# Patient Record
Sex: Female | Born: 1985 | Race: White | Hispanic: No | Smoking: Current some day smoker
Health system: Southern US, Community
[De-identification: ages and names within clinical notes are randomized; demographics above are authoritative.]

## PROBLEM LIST (undated history)

## (undated) DIAGNOSIS — O34219 Maternal care for unspecified type scar from previous cesarean delivery: Secondary | ICD-10-CM

## (undated) DIAGNOSIS — F1120 Opioid dependence, uncomplicated: Secondary | ICD-10-CM

## (undated) DIAGNOSIS — O99330 Smoking (tobacco) complicating pregnancy, unspecified trimester: Secondary | ICD-10-CM

## (undated) DIAGNOSIS — A6000 Herpesviral infection of urogenital system, unspecified: Secondary | ICD-10-CM

## (undated) HISTORY — PX: TUBAL LIGATION: SHX77

## (undated) HISTORY — DX: Maternal care for unspecified type scar from previous cesarean delivery: O34.219

## (undated) HISTORY — DX: Smoking (tobacco) complicating pregnancy, unspecified trimester: O99.330

---
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
## 2006-04-30 ENCOUNTER — Inpatient Hospital Stay (HOSPITAL_COMMUNITY): Admission: AD | Admit: 2006-04-30 | Discharge: 2006-04-30 | Payer: Self-pay | Admitting: Obstetrics and Gynecology

## 2006-06-13 ENCOUNTER — Inpatient Hospital Stay (HOSPITAL_COMMUNITY): Admission: AD | Admit: 2006-06-13 | Discharge: 2006-06-17 | Payer: Self-pay | Admitting: Obstetrics & Gynecology

## 2006-06-13 ENCOUNTER — Ambulatory Visit: Payer: Self-pay | Admitting: Obstetrics & Gynecology

## 2006-10-23 ENCOUNTER — Emergency Department (HOSPITAL_COMMUNITY): Admission: EM | Admit: 2006-10-23 | Discharge: 2006-10-24 | Payer: Self-pay | Admitting: Emergency Medicine

## 2007-06-17 ENCOUNTER — Emergency Department (HOSPITAL_COMMUNITY): Admission: EM | Admit: 2007-06-17 | Discharge: 2007-06-17 | Payer: Self-pay | Admitting: Emergency Medicine

## 2007-09-22 ENCOUNTER — Inpatient Hospital Stay (HOSPITAL_COMMUNITY): Admission: AD | Admit: 2007-09-22 | Discharge: 2007-09-22 | Payer: Self-pay | Admitting: Obstetrics & Gynecology

## 2007-09-25 ENCOUNTER — Inpatient Hospital Stay (HOSPITAL_COMMUNITY): Admission: AD | Admit: 2007-09-25 | Discharge: 2007-09-25 | Payer: Self-pay | Admitting: Obstetrics & Gynecology

## 2007-09-27 ENCOUNTER — Inpatient Hospital Stay (HOSPITAL_COMMUNITY): Admission: AD | Admit: 2007-09-27 | Discharge: 2007-09-27 | Payer: Self-pay | Admitting: Family Medicine

## 2007-10-16 ENCOUNTER — Ambulatory Visit: Payer: Self-pay | Admitting: Family Medicine

## 2008-01-11 ENCOUNTER — Emergency Department (HOSPITAL_COMMUNITY): Admission: EM | Admit: 2008-01-11 | Discharge: 2008-01-11 | Payer: Self-pay | Admitting: Emergency Medicine

## 2008-04-10 ENCOUNTER — Inpatient Hospital Stay (HOSPITAL_COMMUNITY): Admission: AD | Admit: 2008-04-10 | Discharge: 2008-04-10 | Payer: Self-pay | Admitting: Obstetrics and Gynecology

## 2008-06-19 ENCOUNTER — Inpatient Hospital Stay (HOSPITAL_COMMUNITY): Admission: AD | Admit: 2008-06-19 | Discharge: 2008-06-19 | Payer: Self-pay | Admitting: Obstetrics and Gynecology

## 2008-07-22 ENCOUNTER — Inpatient Hospital Stay (HOSPITAL_COMMUNITY): Admission: AD | Admit: 2008-07-22 | Discharge: 2008-07-25 | Payer: Self-pay | Admitting: Obstetrics and Gynecology

## 2008-09-16 ENCOUNTER — Inpatient Hospital Stay (HOSPITAL_COMMUNITY): Admission: AD | Admit: 2008-09-16 | Discharge: 2008-09-17 | Payer: Self-pay | Admitting: Obstetrics and Gynecology

## 2008-11-11 ENCOUNTER — Inpatient Hospital Stay (HOSPITAL_COMMUNITY): Admission: AD | Admit: 2008-11-11 | Discharge: 2008-11-11 | Payer: Self-pay | Admitting: Obstetrics and Gynecology

## 2008-11-20 IMAGING — CT CT ABDOMEN W/ CM
2 of 5 series · 17 of 46 positions shown, 19 images · IV contrast (100 ML OMNI 300)
Comparison: None

CT ABDOMEN

CLINICAL DATA: Bilateral flank pain for 1 day with fever

CT ABDOMEN AND PELVIS WITH CONTRAST
TECHNIQUE: Multidetector CT imaging of the abdomen and pelvis was
performed using the standard protocol following bolus
administration of intravenous contrast.
Contrast: 100 ml Jmnipaque-YDD IV

[Series 2: a&p w/ · axial · 0.64mm/px · z∈[-387,-32]mm · 14 of 79 slices shown, 16 images]
[im 5/79  soft-tissue]
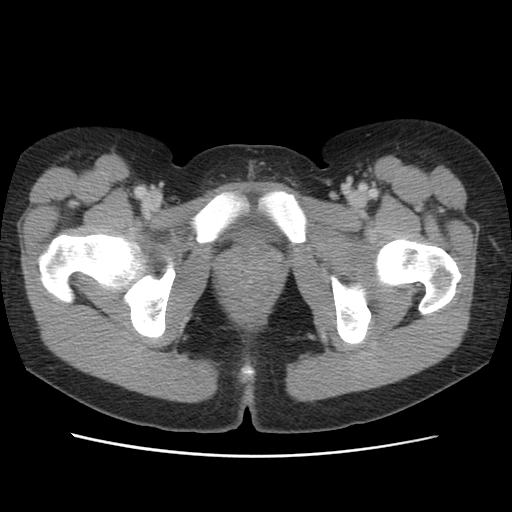
[im 5/79  bone]
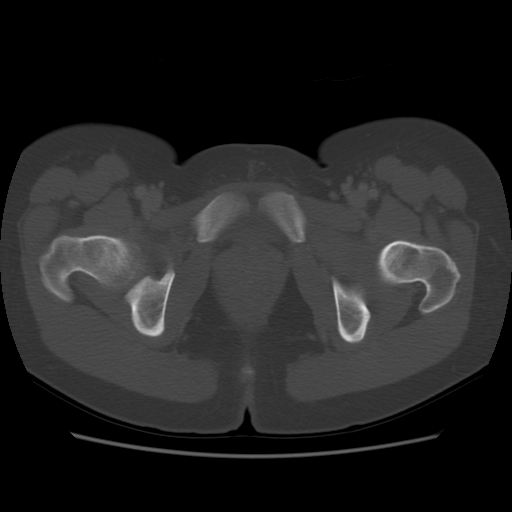
[im 9/79  soft-tissue]
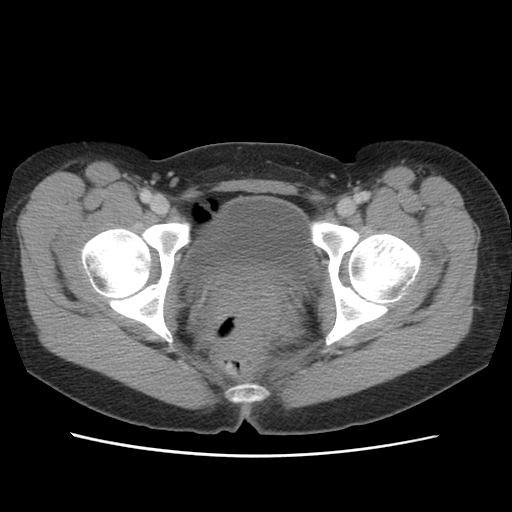
[im 18/79  soft-tissue]
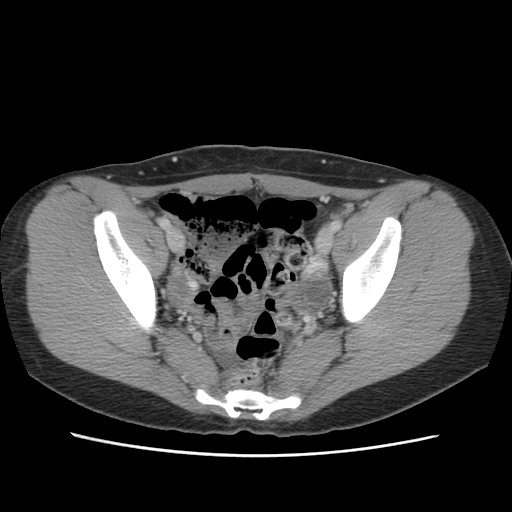
[im 22/79  soft-tissue]
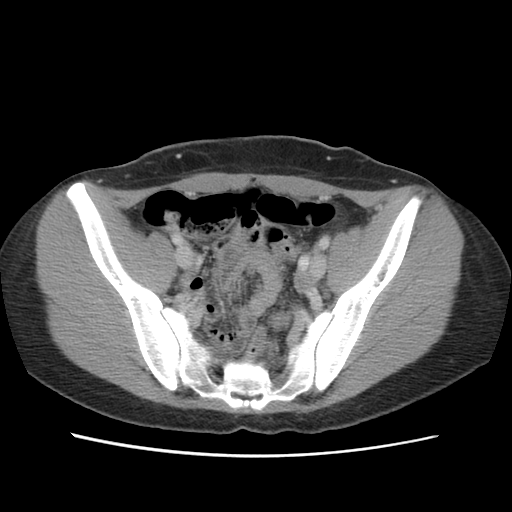
[im 27/79  soft-tissue]
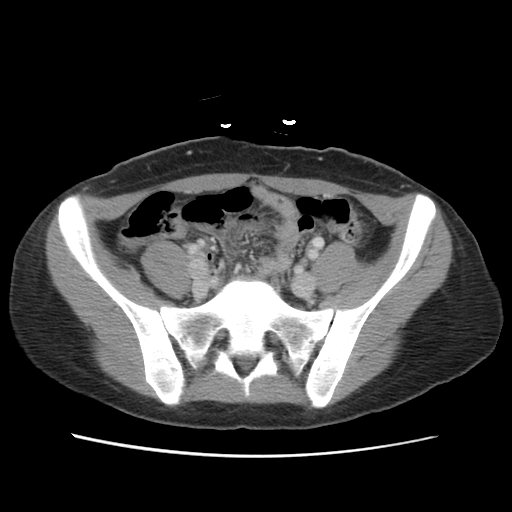
[im 31/79  soft-tissue]
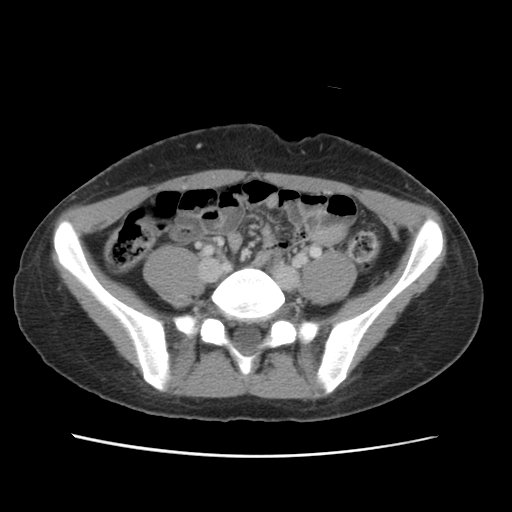
[im 35/79  soft-tissue]
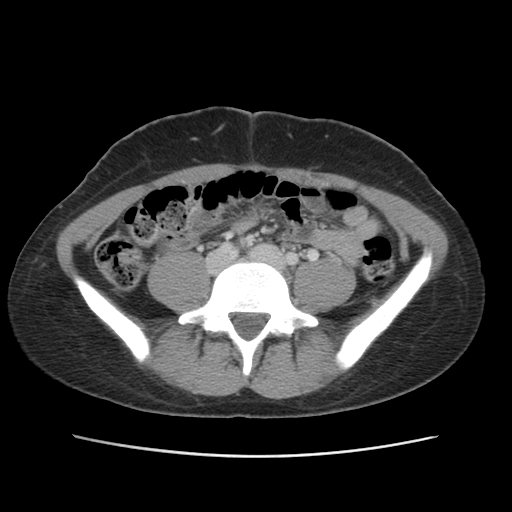
[im 44/79  soft-tissue]
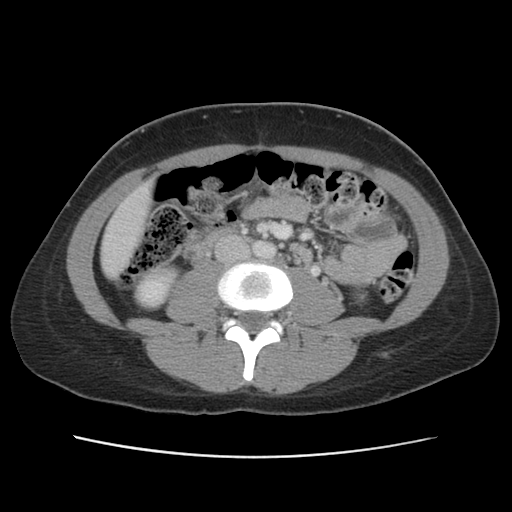
[im 48/79  soft-tissue]
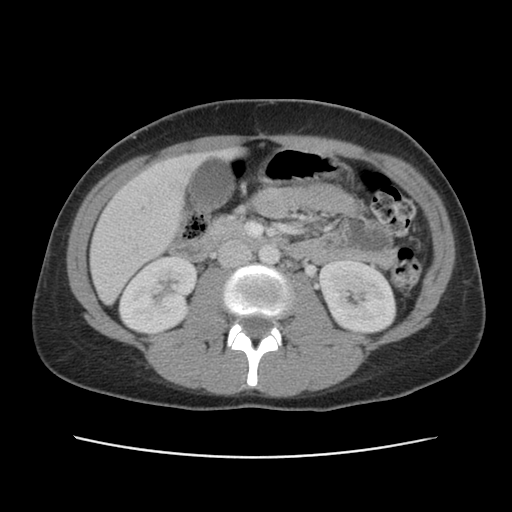
[im 48/79  bone]
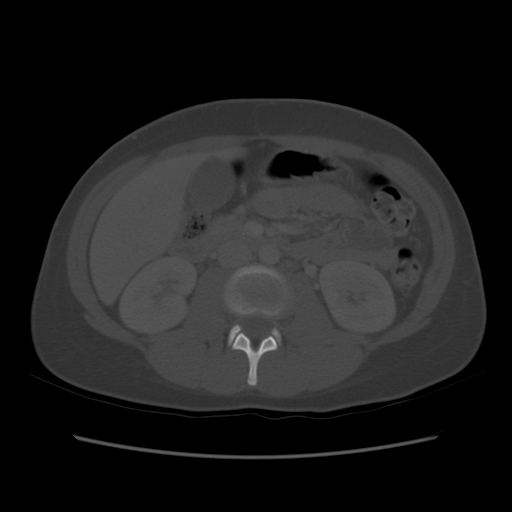
[im 53/79  soft-tissue]
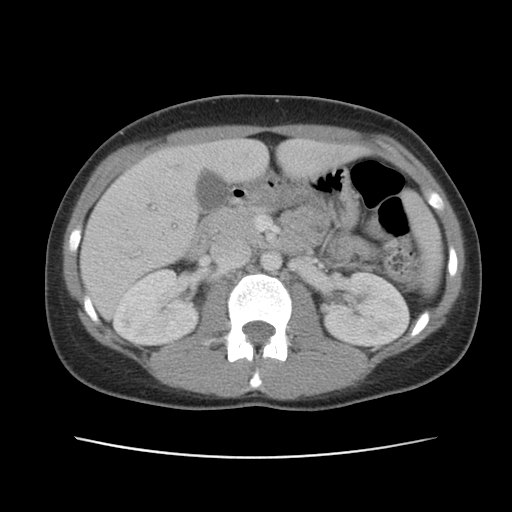
[im 57/79  soft-tissue]
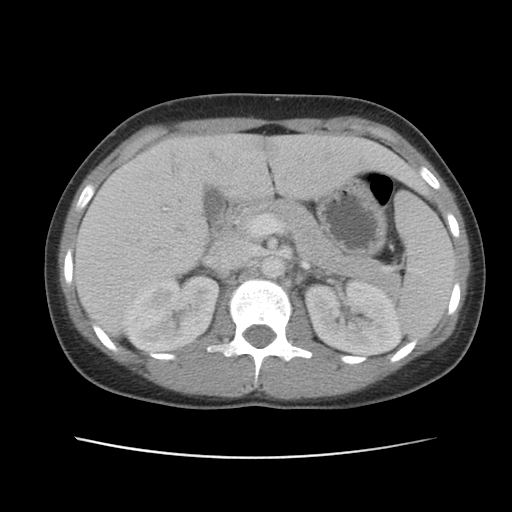
[im 61/79  soft-tissue]
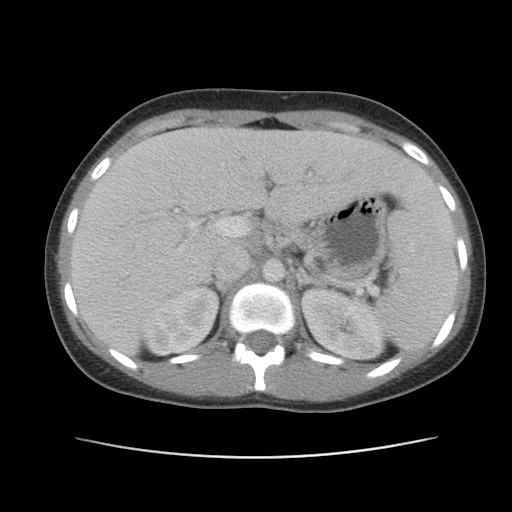
[im 70/79  soft-tissue]
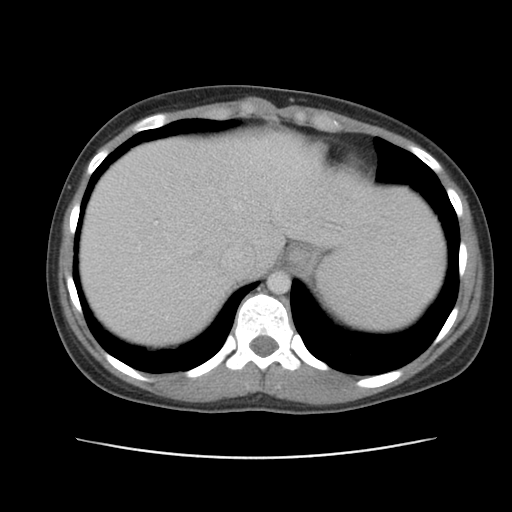
[im 74/79  soft-tissue]
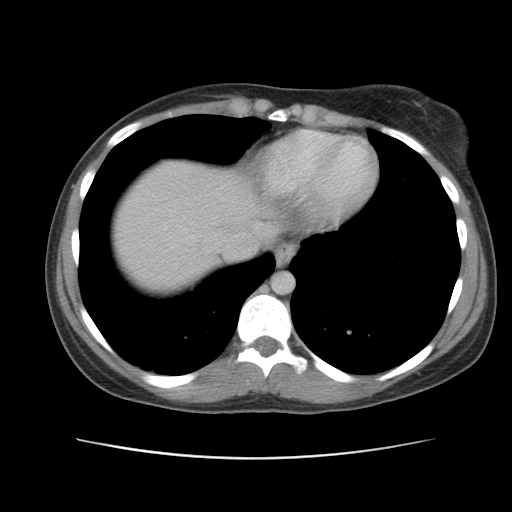

[Series 400: cor abd · coronal · 0.86mm/px · 3 of 88 slices shown]
[im 30/88  soft-tissue]
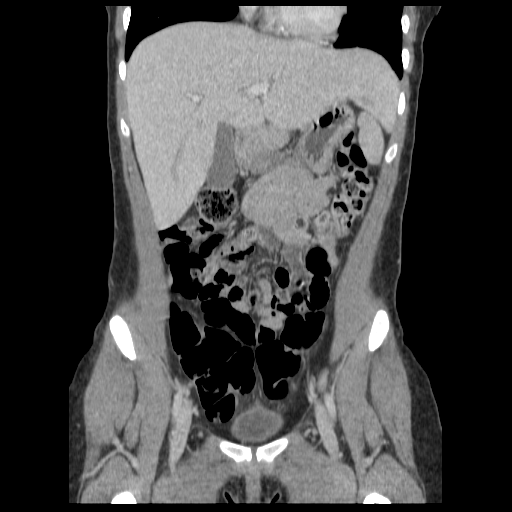
[im 39/88  soft-tissue]
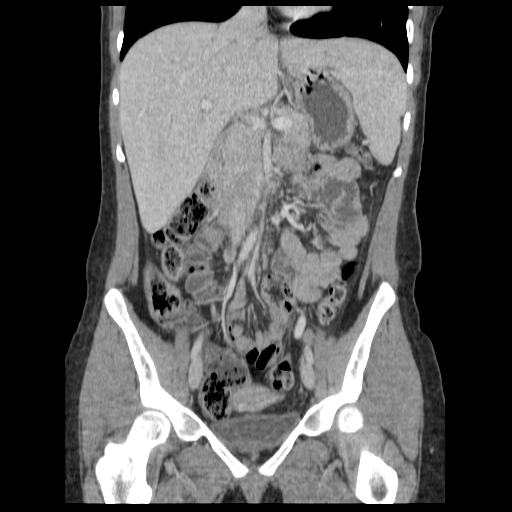
[im 49/88  soft-tissue]
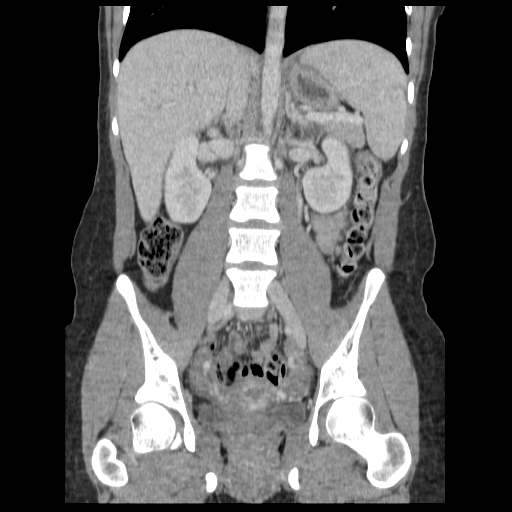

[17 of 46 positions shown; findings below may reference images not displayed]

FINDINGS: No acute renal or perirenal abnormality.  Although the
images of the kidneys are unremarkable, acute pyelonephritis cannot
be excluded.  Spleen appears mildly prominent in size.  CT findings
compatible mild periportal edema, the etiology which is uncertain.
This could be due to a process such as acute hepatitis.  Are the
liver function values within normal limits.  The pancreas and
gallbladder, and adrenal glands are unremarkable.  No biliary
ductal dilatation.
IMPRESSION: Minimal splenomegaly.  Unremarkable appearance of the kidneys.
Mild periportal edema pattern raises the question of an hepatic
process such as acute hepatitis.

CT PELVIS
FINDINGS: No pelvic mass.  Bilateral ovarian follicles.  Small
amount of free  fluid in the cul-de-sac, a nonspecific finding.
This may be physiologic.
IMPRESSION: Small amount of free pelvic fluid, nonspecific.

## 2008-11-21 ENCOUNTER — Inpatient Hospital Stay (HOSPITAL_COMMUNITY): Admission: AD | Admit: 2008-11-21 | Discharge: 2008-11-21 | Payer: Self-pay | Admitting: Obstetrics & Gynecology

## 2008-11-21 ENCOUNTER — Ambulatory Visit: Payer: Self-pay | Admitting: Advanced Practice Midwife

## 2008-11-30 ENCOUNTER — Inpatient Hospital Stay (HOSPITAL_COMMUNITY): Admission: RE | Admit: 2008-11-30 | Discharge: 2008-12-03 | Payer: Self-pay | Admitting: Obstetrics and Gynecology

## 2010-02-04 ENCOUNTER — Emergency Department (HOSPITAL_COMMUNITY): Admission: EM | Admit: 2010-02-04 | Discharge: 2010-02-04 | Payer: Self-pay | Admitting: Emergency Medicine

## 2010-03-03 ENCOUNTER — Emergency Department (HOSPITAL_COMMUNITY): Admission: EM | Admit: 2010-03-03 | Discharge: 2010-03-03 | Payer: Self-pay | Admitting: Emergency Medicine

## 2010-07-23 HISTORY — DX: Maternal care for unspecified type scar from previous cesarean delivery: O34.219

## 2010-09-30 ENCOUNTER — Emergency Department (HOSPITAL_COMMUNITY)
Admission: EM | Admit: 2010-09-30 | Discharge: 2010-09-30 | Disposition: A | Discharge status: Home / Self Care | Payer: Self-pay | Attending: Emergency Medicine | Admitting: Emergency Medicine

## 2010-09-30 ENCOUNTER — Emergency Department (HOSPITAL_COMMUNITY): Payer: Self-pay

## 2010-09-30 DIAGNOSIS — R0789 Other chest pain: Secondary | ICD-10-CM | POA: Insufficient documentation

## 2010-09-30 LAB — POCT PREGNANCY, URINE: Preg Test, Ur: NEGATIVE

## 2010-10-06 LAB — URINALYSIS, ROUTINE W REFLEX MICROSCOPIC
Bilirubin Urine: NEGATIVE
Protein, ur: NEGATIVE mg/dL
Specific Gravity, Urine: 1.018 (ref 1.005–1.030)
Urobilinogen, UA: 1 mg/dL (ref 0.0–1.0)
pH: 7 (ref 5.0–8.0)

## 2010-10-06 LAB — URINE MICROSCOPIC-ADD ON

## 2010-10-07 LAB — CBC
Hemoglobin: 12.6 g/dL (ref 12.0–15.0)
MCH: 33.8 pg (ref 26.0–34.0)
MCV: 97.8 fL (ref 78.0–100.0)
RBC: 3.74 MIL/uL — ABNORMAL LOW (ref 3.87–5.11)

## 2010-10-07 LAB — URINALYSIS, ROUTINE W REFLEX MICROSCOPIC
Bilirubin Urine: NEGATIVE
Hgb urine dipstick: NEGATIVE
Ketones, ur: NEGATIVE mg/dL
Specific Gravity, Urine: 1.01 (ref 1.005–1.030)
Urobilinogen, UA: 0.2 mg/dL (ref 0.0–1.0)
pH: 6.5 (ref 5.0–8.0)

## 2010-10-07 LAB — WET PREP, GENITAL

## 2010-10-07 LAB — BASIC METABOLIC PANEL
CO2: 26 mEq/L (ref 19–32)
Calcium: 8.5 mg/dL (ref 8.4–10.5)
Chloride: 111 mEq/L (ref 96–112)
GFR calc Af Amer: >60 mL/min (ref >60)
Sodium: 141 mEq/L (ref 135–145)

## 2010-10-07 LAB — POCT PREGNANCY, URINE: Preg Test, Ur: NEGATIVE

## 2010-10-07 LAB — DIFFERENTIAL
Eosinophils Absolute: 0.3 10*3/uL (ref 0.0–0.7)
Lymphs Abs: 2.9 10*3/uL (ref 0.7–4.0)
Monocytes Relative: 6 % (ref 3–12)
Neutrophils Relative %: 55 % (ref 43–77)

## 2010-10-07 LAB — GC/CHLAMYDIA PROBE AMP, GENITAL: GC Probe Amp, Genital: NEGATIVE

## 2010-10-31 LAB — RPR: RPR Ser Ql: NONREACTIVE

## 2010-10-31 LAB — CBC
HCT: 28.8 % — ABNORMAL LOW (ref 36.0–46.0)
HCT: 37.1 % (ref 36.0–46.0)
Hemoglobin: 13.2 g/dL (ref 12.0–15.0)
MCV: 97.3 fL (ref 78.0–100.0)
Platelets: 149 10*3/uL — ABNORMAL LOW (ref 150–400)
Platelets: 212 10*3/uL (ref 150–400)
WBC: 12.4 10*3/uL — ABNORMAL HIGH (ref 4.0–10.5)
WBC: 13.4 10*3/uL — ABNORMAL HIGH (ref 4.0–10.5)

## 2010-10-31 LAB — CCBB MATERNAL DONOR DRAW

## 2010-10-31 LAB — TYPE AND SCREEN
ABO/RH(D): O POS
Antibody Screen: NEGATIVE

## 2010-11-06 LAB — COMPREHENSIVE METABOLIC PANEL
ALT: 10 U/L (ref 0–35)
AST: 16 U/L (ref 0–37)
Alkaline Phosphatase: 50 U/L (ref 39–117)
CO2: 24 mEq/L (ref 19–32)
Chloride: 109 mEq/L (ref 96–112)
GFR calc non Af Amer: >60 mL/min (ref >60)
Glucose, Bld: 92 mg/dL (ref 70–99)
Potassium: 3.5 mEq/L (ref 3.5–5.1)
Sodium: 139 mEq/L (ref 135–145)

## 2010-11-06 LAB — DIFFERENTIAL
Basophils Relative: 0 % (ref 0–1)
Basophils Relative: 0 % (ref 0–1)
Eosinophils Absolute: 0 10*3/uL (ref 0.0–0.7)
Eosinophils Absolute: 0.1 10*3/uL (ref 0.0–0.7)
Eosinophils Relative: 1 % (ref 0–5)
Monocytes Relative: 8 % (ref 3–12)
Monocytes Relative: 8 % (ref 3–12)
Neutro Abs: 5.6 10*3/uL (ref 1.7–7.7)
Neutrophils Relative %: 69 % (ref 43–77)
Neutrophils Relative %: 77 % (ref 43–77)

## 2010-11-06 LAB — CBC
Hemoglobin: 9.6 g/dL — ABNORMAL LOW (ref 12.0–15.0)
MCHC: 34.9 g/dL (ref 30.0–36.0)
Platelets: 151 10*3/uL (ref 150–400)
RBC: 2.69 MIL/uL — ABNORMAL LOW (ref 3.87–5.11)
RBC: 2.8 MIL/uL — ABNORMAL LOW (ref 3.87–5.11)
WBC: 8.1 10*3/uL (ref 4.0–10.5)
WBC: 9.8 10*3/uL (ref 4.0–10.5)

## 2010-11-07 LAB — CBC
MCV: 98.1 fL (ref 78.0–100.0)
Platelets: 238 10*3/uL (ref 150–400)
RBC: 3.52 MIL/uL — ABNORMAL LOW (ref 3.87–5.11)
WBC: 15.3 10*3/uL — ABNORMAL HIGH (ref 4.0–10.5)

## 2010-11-07 LAB — URINALYSIS, ROUTINE W REFLEX MICROSCOPIC
Hgb urine dipstick: NEGATIVE
Nitrite: NEGATIVE
Protein, ur: NEGATIVE mg/dL
Specific Gravity, Urine: 1.025 (ref 1.005–1.030)
Urobilinogen, UA: 0.2 mg/dL (ref 0.0–1.0)

## 2010-11-17 ENCOUNTER — Emergency Department (HOSPITAL_BASED_OUTPATIENT_CLINIC_OR_DEPARTMENT_OTHER)
Admission: EM | Admit: 2010-11-17 | Discharge: 2010-11-17 | Disposition: A | Discharge status: Home / Self Care | Payer: Self-pay | Attending: Emergency Medicine | Admitting: Emergency Medicine

## 2010-11-17 DIAGNOSIS — F17200 Nicotine dependence, unspecified, uncomplicated: Secondary | ICD-10-CM | POA: Insufficient documentation

## 2010-11-17 DIAGNOSIS — Y92410 Unspecified street and highway as the place of occurrence of the external cause: Secondary | ICD-10-CM | POA: Insufficient documentation

## 2010-11-17 DIAGNOSIS — S239XXA Sprain of unspecified parts of thorax, initial encounter: Secondary | ICD-10-CM | POA: Insufficient documentation

## 2010-12-05 NOTE — Op Note (Signed)
[name], [name]            ACCOUNT NO.:  [id number]   MEDICAL RECORD NO.:  [id number]          PATIENT TYPE:  INP   LOCATION:  9133                          [facility]ACILITY:  [facility]   PHYSICIAN:  Kendra H. Tenny Craw, MD     DATE OF BIRTH:  [birth year]   DATE OF PROCEDURE:  11/30/2008  DATE OF DISCHARGE:                               OPERATIVE REPORT   PREOPERATIVE DIAGNOSES:  1. A 39/5-week intrauterine pregnancy.  2. History of prior low transverse cesarean section, declines trial of      labor.   POSTOPERATIVE DIAGNOSES:  1. A 39/5-week intrauterine pregnancy.  2. History of prior low transverse cesarean section, declines trial of      labor.   PROCEDURE:  Repeat low transverse cesarean section.   SURGEON:  Freddrick March. Tenny Craw, MD   ASSISTANT:  Haley Aschoff, MD   ANESTHESIA:  Spinal.   OPERATIVE FINDINGS:  Vigorous female infant in the vertex presentation  weighing 6 pounds 7 ounces with Apgar scores of 9 at 1 and 9 and 5  minutes.  Normal-appearing ovaries, tubes, and uterus.  Slight adhesive  disease involving the lower uterine segment and the anterior abdominal  peritoneum.   SPECIMENS:  Placenta disposed specimens to L and D for disposal.   ESTIMATED BLOOD LOSS:  600 mL.   URINE OUTPUT:  300 mL of clear urine.   COMPLICATIONS:  None.   PROCEDURE:  Haley Wiley is a 25 year old G3, P1-0-1-1, who presented 39  weeks and 5 days estimated gestational age for a scheduled repeat  cesarean section for history of a prior C-section and declining a trial  of labor.  Following the appropriate informed consent, the patient was  brought to the operating room, where spinal anesthesia was administered  and found to be adequate.  She was placed in the dorsal supine position  with a leftward tilt, prepped, and draped in the normal sterile fashion.  Scalpel was used to make a Pfannenstiel's skin incision along her prior  Pfannenstiel incision.  This incision was carried down through the  underlying layers of soft tissue to the fascia.  Fascia was incised in  the midline.  Fascial incision was extended laterally with Mayo  scissors.  The superior aspect of the fascial incision was grasped with  Kocher clamps x2.  The tented up the underlying rectus muscle was  dissected off sharply with the electrocautery unit.  The same procedure  was repeated on the inferior aspect of the fascial incision.  The rectus  muscles were then separated in the midline.  The abdominal peritoneum  was identified, tented up, entered sharply with the Metzenbaum.  The  incision was extended superiorly and inferiorly with good visualization  of the bladder.  The anterior abdominal peritoneum was dissected off the  vesicouterine peritoneum, where it had become adhesed from her prior  surgery and the bladder flap was created digitally.  Scalpel was then  used to make a low transverse incision on the uterus which was extended  laterally with blunt dissection.  Amniotomy was performed for clear  fluid.  Fetal vertex was identified  and easily delivered through the  uterine incision followed by the body.  A loose nuchal cord x1 was  noted.  Infant cried vigorously on the operative field.  Cord was  clamped and cut.  Infant was passed to the awaiting pediatricians.  The  placenta was then spontaneously delivered.  The uterus was exteriorized,  cleared of all clot and debris.  The uterine incision was repaired with  #1 chromic in a running locked fashion with a second imbricating layer.  Ovaries and tubes were inspected and found to be normal.  Uterus was  returned to the abdominal cavity.  Abdominal cavity was cleared of all  clot and debris.  The abdominal peritoneum was then reapproximated with  2-0 Vicryl.  The rectus muscles were reapproximated with #1 chromic in a  running fashion.  The fascia was closed with a looped PDS and the skin  was closed with staples.  All sponge, lap, needle counts were  correct  x2.  The patient tolerated the procedure well and was brought to the  recovery room in stable condition following the procedure.      Freddrick March. Tenny Craw, MD  Electronically Signed     KHR/MEDQ  D:  11/30/2008  T:  12/01/2008  Job:  [id number]

## 2010-12-05 NOTE — Discharge Summary (Signed)
NAMEELIDA, Haley Wiley            ACCOUNT NO.:  [id number]   MEDICAL RECORD NO.:  [id number]          PATIENT TYPE:  INP   LOCATION:  9155                          [facility]ACILITY:  [facility]   PHYSICIAN:  Gerrit Friends. Aldona Bar, M.D.   DATE OF BIRTH:  [birth year]   DATE OF ADMISSION:  07/22/2008  DATE OF DISCHARGE:  07/26/2007                               DISCHARGE SUMMARY   DISCHARGE DIAGNOSES:  1. A 21-week intrauterine pregnancy, delivered.  2. Presumed pyelonephritis - culture negative.   SUMMARY:  This 25 year old gravida 3, para 1 was admitted at 21 weeks'  gestation on December 31 by Dr. Henderson Cloud because of suspected  pyelonephritis.  She was begun on Ancef intravenously.  Her white count  responded.  Her low-grade fever defervesced.  On the morning of January  1, she was still very uncomfortable especially in the left CVA area.  An  ultrasound was done to rule out ureteral obstruction.  Ultrasound did  rule out ureteral obstruction.  She was continued on her Ancef,  ultimately having it decreased on January 1 and on the morning of  January 3, it was felt to be doing much better, was afebrile, vital  signs were otherwise stable, and her white count returned as being  normal along with normal differential.  Her examination found minimal  tenderness and the patient was anxious to be discharged and accordingly  was given all appropriate instructions and discharged to home.   DISCHARGE MEDICATIONS:  Vitamins 1 a day as she has been doing, Tylenol  as needed for discomfort in appropriate doses, and she was given a  prescription for Keflex 500 mg to use 3 times a day for the next 5 days.   She will return to the office in follow up in approximately 10 days time  or as needed.  It is of note that her culture was negative - however a  previous culture done in June 2009 where she presented with similar  symptoms before she got pregnant, did grow E-coli greater than 1000  colonies per mL.   CONDITION  ON DISCHARGE:  Improved.   FOLLOWUP:  As mentioned.      Gerrit Friends. Aldona Bar, M.D.  Electronically Signed     RMW/MEDQ  D:  07/25/2008  T:  07/25/2008  Job:  [id number]

## 2010-12-05 NOTE — Group Therapy Note (Addendum)
Haley Wiley, Haley Wiley            ACCOUNT NO.:  [id number]   MEDICAL RECORD NO.:  [id number]          PATIENT TYPE:  WOC   LOCATION:  [facility]                   FACILITY:  [facility]   PHYSICIAN:  Tinnie Gens, MD        DATE OF BIRTH:  1986-07-01   DATE OF SERVICE:  10/16/2007                                  CLINIC NOTE   CHIEF COMPLAINT:  Follow-up SAB.   HISTORY OF PRESENT ILLNESS:  The patient is a 25 year old gravida 2,  para 1-0-1-1 who had a miscarriage at 5 weeks approximately 3 weeks ago.  She is here for follow-up.  She is without significant complaint today.  Her bleeding has been resolved for the past week.   PAST MEDICAL HISTORY:  Heart murmur.   PAST SURGICAL HISTORY:  Negative.   MEDICATIONS:  None.   ALLERGIES:  None known.   OBSTETRICAL HISTORY:  She is a G2, P1 with one C section.   GYN HISTORY:  Menarche at age 17, cycles are monthly.  Lasts 5-7 days  with medium flow.  No history of abnormal Pap.   FAMILY HISTORY:  Heart disease, hypertension and lung cancer.   SOCIAL HISTORY:  She is a smoker. One-half pack per day for the past 11  years.  No ____ QA MARKER: 81 alcohol use.                                    REVIEW OF SYSTEMS:  14 point review of systems reviewed.  Please see GYN  history in the chart.  Positive for muscle aches, fevers, night sweats,  fatigue, headaches, dizzy spells, vaginal bleeding and painful  intercourse.   PHYSICAL EXAMINATION:  VITALS:  On exam today her vitals are as in the  chart.  GENERAL:  She well-nourished female in no acute distress.  ABDOMEN:  Soft, nontender, nondistended.  GU:  Normal external female genitalia, BUN normal.  Vagina is pink and  rugated.  Cervix is nulliparous without lesion.  Uterus is small,  anteverted.  No adnexal mass or tenderness.   IMPRESSION:  Status post SAB.   PLAN:  1. Discussed birth control.  2. Do not get pregnant for 3 months. Increased risk of congenital      anomaly.  3. Needs  Pap, last was in 06/2006. [facility]  4. Should get Gardisil series.  5. Stop smoking.  6. Extended conversation about  problem or risk of miscarriage and      reason for it was had with this patient and her partner.           ______________________________  Tinnie Gens, MD     TP/MEDQ  D:  10/16/2007  T:  10/17/2007  Job:  [id number]

## 2010-12-08 NOTE — Op Note (Signed)
NAMESIBLE, STRALEY              ACCOUNT NO.:  [id number]   MEDICAL RECORD NO.:  [id number]          PATIENT TYPE:  INP   LOCATION:  9126                          [facility]   PHYSICIAN:  Kendra H. Tenny Craw, MD     DATE OF BIRTH:  12/03/1985   DATE OF PROCEDURE:  06/14/2006  DATE OF DISCHARGE:                                 OPERATIVE REPORT   PREOPERATIVE DIAGNOSES:  1. A 41-week intrauterine pregnancy.  2. Deep transverse arrest.   POSTOPERATIVE DIAGNOSES:  1. A 41-week intrauterine pregnancy.  2. Deep transverse arrest.   PROCEDURE:  Primary low transverse cesarean section via Pfannenstiel skin  incision.   SURGEON:  Freddrick March. Tenny Craw, M.D.   ASSISTANT:  Lesly Dukes, M.D.   ANESTHESIA:  Epidural.   SPECIMEN:  Placenta to labor and delivery.   ESTIMATED BLOOD LOSS:  700 mL.   COMPLICATIONS:  None.   OPERATIVE FINDINGS:  Female infant in the vertex right occiput transverse  presentation, weight 8 pounds, with Apgar scores of 5, 7 and 8.  Cord gas  was collected but was not sent due to inadequate amount of arterial blood  collected.   PROCEDURE:  Miss Kalman Jewels is a 25 year old g-1 p-0 at [redacted] weeks gestational age  who presented to labor and delivery in spontaneous labor on June 13, 2006.  She progressed in labor and made it to complete-complete.  At pushing  she pushed for 2 hours, with little descent of the fetal head.  On exam, the  fetal vertex was found to be in the right occiput transverse position.  Despite multiple attempts to rotate the fetal head with maternal expulsive  effort, the infant was unable to be rotated and the decision was made to  proceed with primary low transverse cesarean section.  Following the  appropriate informed consent, the patient was brought to the operating room,  where she was placed in the dorsal supine position with a leftward tilt and  prepped and draped in the normal sterile fashion.  Epidural anesthesia was  confirmed to be  adequate.  A Pfannenstiel skin incision was made with the  knife and carried down through the underlying layers of soft tissue to the  fascia.  The fascia was incised in the midline, fascial incision was  extended laterally with the Mayo scissors.  The superior aspect of the  fascial incision was grasped with Kocher clamps x2, tented up and the  underlying  rectus muscle was dissected off sharply with the electrocautery  unit.  The same procedure was repeated on the inferior aspect of the fascial  incision.  The rectus muscles were then separated in the midline.  The  abdominal peritoneum was identified, tented up, entered sharply with the  Metzenbaum scissors and the incision was extended inferiorly and superiorly,  with good visualization of the bladder.  The bladder blade was then inserted  and the vesicouterine pertoneum was identified, tented up, entered sharply  with the Metzenbaum scissors.  The incision was extended laterally and the  bladder flap was created digitally.  The bladder flap was  then reinserted.  The scalpel was used to make a low transverse incision on the uterus, which  was extended laterally with blunt dissection.  The fetal vertex was  identified, brought up through the uterine incision and delivered  atraumatically, followed by the infant's body.  The infant was bulb  suctioned on the operative field.  Cord was clamped and cut.  The infant was  passed to the waiting pediatricians.  An attempt at arterial blood  collection was made.  However, this was unsuccessful due to inadequate  amount of arterial blood to send gases.  The placenta was initially  attempted to be delivered spontaneously.  However, ultimately required  manual extraction.  The uterus was exteriorized, cleared of all clot debris.  The uterine incision was closed with 0 Vicryl in a running locked fashion.  Two figure-of-eight sutures were used on the right-hand aspect of the  fascial incision to  control bleeding.  An electrocautery unit was used to  coagulate other extraneous bleeders.  Of note, during the repair of the  uterine incision the uterus was noted to be boggy.  The patient's bleeding  was not extensive.  However, given the boggy nature of the uterus the  decision was made to administer Methergine IM x1 and an additional 20 units  of Pitocin was placed in the second bag of IV fluids for a total of 40 units  in a liter.  Following repair of the uterine incision, the ovaries and tubes  were inspected and found to be within normal limits.  The uterine incision  was found to be hemostatic.  The uterus was placed in the abdominal cavity.  The abdominal cavity was irrigated and cleared of clot debris.  The uterine  incision was then reinspected and found to be hemostatic and the peritoneum  was then closed with 2-0 Vicryl in a running fashion and the fascia was  closed with 0 looped PDS in a running fashion and the skin was closed with  staples.  All sponge, lap and needle counts were correct x2.  The patient  tolerated the procedure well and was brought to the recovery room in stable  condition following the procedure.           ______________________________  Freddrick March. Tenny Craw, MD     KHR/MEDQ  D:  06/14/2006  T:  06/14/2006  Job:  [id number]   cc:   Lesly Dukes, M.D.

## 2010-12-08 NOTE — Discharge Summary (Signed)
Haley Wiley, Haley Wiley            ACCOUNT NO.:  [id number]   MEDICAL RECORD NO.:  [id number]          PATIENT TYPE:  INP   LOCATION:  9133                          [facility]   PHYSICIAN:  Malva Limes, M.D.    DATE OF BIRTH:  18-Jan-1986   DATE OF ADMISSION:  11/30/2008  DATE OF DISCHARGE:  12/03/2008                               DISCHARGE SUMMARY   FINAL DIAGNOSES:  1. Intrauterine gestation at 39-5/7 weeks.  2. History of prior cesarean section.  3. The patient to decline a trial of labor.   PROCEDURE:  Repeat low transverse cesarean section.   SURGEON:  Freddrick March. Tenny Craw, MD.   ASSISTANT:  Miguel Aschoff, M.D.   COMPLICATIONS:  None.   HOSPITAL COURSE:  This 25 year old, G3, P1-0-1-1 presented at 39-5/[redacted]  weeks gestation for repeat cesarean section.  The patient had a prior  cesarean section with her last pregnancy and declines a trial of labor  with this pregnancy.  The patient's antepartum course up to this point  had been uncomplicated.  She did have a positive group B strep culture  obtained in our office at 35 weeks.  The patient is taken the operating  room on Nov 30, 2008 by Dr. Waynard Reeds, a repeat low transverse  cesarean section was performed with the delivery of a 6 pounds 7 ounces  female infant with Apgars of 9 and 9.  There was some adhesions noted in  the lower uterine segment and anterior abdominal peritoneum.  The  patient tolerated the procedure well.  The patient's postoperative  course was benign without any significant fevers.  She did have the  little boy circumcised prior to discharge.  The patient was sent home on  a regular diet, told to decrease activities, told to continue her  prenatal vitamins, was given Percocet 1-2 every 4-6 hours as needed for  pain, was to follow up in our office in 4 weeks.  Instructions and  precautions were reviewed with the patient.  Labs on discharge, the  patient had a hemoglobin of 10.3, white blood cell count of 12.4,  and  platelets of 149,000.      Leilani Able, P.A.-C.    ______________________________  Malva Limes, M.D.    MB/MEDQ  D:  12/15/2008  T:  12/16/2008  Job:  [id number]

## 2010-12-08 NOTE — Discharge Summary (Signed)
Haley Wiley, Haley Wiley              ACCOUNT NO.:  [id number]   MEDICAL RECORD NO.:  [id number]          PATIENT TYPE:  INP   LOCATION:  9126                          [facility]   PHYSICIAN:  Gerrit Friends. Aldona Bar, M.D.   DATE OF BIRTH:  1986/02/23   DATE OF ADMISSION:  06/13/2006  DATE OF DISCHARGE:  06/17/2006                                 DISCHARGE SUMMARY   DISCHARGE DIAGNOSES:  1. Intrauterine pregnancy, 41 weeks, delivered 8-pound female infant, Apgars      5, 7, and 8.  2. Blood type, O+.  3. Deep transverse arrest.   PROCEDURE:  Primary low-transverse cesarean section.   SUMMARY:  This patient was admitted on the evening of 06/13/06 in early  active labor.  She is a 25 year old primigravida at [redacted] weeks gestation.  Membranes ruptured at 1 a.m. on 06/14/06 and the patient was placed on  Pitocin augmentation with progression.  She was noted to have continued  progression until the mid afternoon of 06/14/06 at which time she began  pushing.  There was a problem with pushing in that the patient developed a  deep transverse arrest and in spite of multiple attempts to rotate manually,  it was not correctable.   The patient was taken to the operating room where she underwent a primary  low transverse cesarean section with delivery of an 8-pound female infant with  Apgars of 5, 7, and 8.  The patient's postpartum course was totally benign.  Her discharge hemoglobin was 11 with a white count of 23,700.   On the morning of 06/17/06, the baby was circumcised and the mother's  evaluation was negative.  Her wound was clean and dry.  Fundus was firm.  Vital signs were stable.  She was pumping and bottle-feeding, tolerating a  regular diet well, having normal bowel and bladder function, was desirous of  discharge.  Accordingly, her staples were removed.  Her wound was Steri-  Stripped with Benzoin.   She was discharged to home with all appropriate instructions.  She will  return to the  office in approximately 4 weeks' time or as needed.   MEDICATIONS AT THE TIME OF DISCHARGE:  1. Vitamins 1 a day.  2. Tylox 1-2 every 4-6 hours as needed for severe pain.  3. Motrin 600 mg as needed for cramping to use every 6 hours.   CONDITION ON DISCHARGE:  Improved.      Gerrit Friends. Aldona Bar, M.D.  Electronically Signed    RMW/MEDQ  D:  06/17/2006  T:  06/17/2006  Job:  [id number]

## 2011-01-14 ENCOUNTER — Inpatient Hospital Stay (HOSPITAL_COMMUNITY)
Admission: AD | Admit: 2011-01-14 | Discharge: 2011-01-14 | Disposition: A | Discharge status: Home / Self Care | Payer: Self-pay | Source: Ambulatory Visit | Attending: Obstetrics & Gynecology | Admitting: Obstetrics & Gynecology

## 2011-01-14 DIAGNOSIS — A499 Bacterial infection, unspecified: Secondary | ICD-10-CM

## 2011-01-14 DIAGNOSIS — O99330 Smoking (tobacco) complicating pregnancy, unspecified trimester: Secondary | ICD-10-CM | POA: Insufficient documentation

## 2011-01-14 DIAGNOSIS — N760 Acute vaginitis: Secondary | ICD-10-CM

## 2011-01-14 DIAGNOSIS — B9689 Other specified bacterial agents as the cause of diseases classified elsewhere: Secondary | ICD-10-CM | POA: Insufficient documentation

## 2011-01-14 DIAGNOSIS — R51 Headache: Secondary | ICD-10-CM | POA: Insufficient documentation

## 2011-01-14 LAB — URINALYSIS, ROUTINE W REFLEX MICROSCOPIC
Glucose, UA: NEGATIVE mg/dL
Ketones, ur: NEGATIVE mg/dL
Nitrite: NEGATIVE
Specific Gravity, Urine: 1.02 (ref 1.005–1.030)
pH: 7 (ref 5.0–8.0)

## 2011-01-14 LAB — URINE MICROSCOPIC-ADD ON

## 2011-01-14 LAB — WET PREP, GENITAL: Trich, Wet Prep: NONE SEEN

## 2011-01-16 LAB — GC/CHLAMYDIA PROBE AMP, GENITAL
Chlamydia, DNA Probe: NEGATIVE
GC Probe Amp, Genital: NEGATIVE

## 2011-01-17 LAB — URINE CULTURE
Colony Count: >=100000
Culture  Setup Time: 201206250008

## 2011-02-21 ENCOUNTER — Emergency Department (HOSPITAL_BASED_OUTPATIENT_CLINIC_OR_DEPARTMENT_OTHER)
Admission: EM | Admit: 2011-02-21 | Discharge: 2011-02-21 | Disposition: A | Discharge status: Home / Self Care | Payer: Self-pay | Attending: Emergency Medicine | Admitting: Emergency Medicine

## 2011-02-21 ENCOUNTER — Encounter: Payer: Self-pay | Admitting: *Deleted

## 2011-02-21 DIAGNOSIS — O2690 Pregnancy related conditions, unspecified, unspecified trimester: Secondary | ICD-10-CM | POA: Insufficient documentation

## 2011-02-21 DIAGNOSIS — IMO0002 Reserved for concepts with insufficient information to code with codable children: Secondary | ICD-10-CM | POA: Insufficient documentation

## 2011-02-21 DIAGNOSIS — L02419 Cutaneous abscess of limb, unspecified: Secondary | ICD-10-CM

## 2011-02-21 MED ORDER — CLINDAMYCIN HCL 150 MG PO CAPS: 300.0000 mg | ORAL_CAPSULE | Freq: Three times a day (TID) | ORAL | Status: AC

## 2011-02-21 MED ORDER — OXYCODONE-ACETAMINOPHEN 5-325 MG PO TABS: 1.0000 | ORAL_TABLET | ORAL | Status: AC | PRN

## 2011-02-21 NOTE — ED Notes (Signed)
Pt has boil under right arm pit. States "popped" two days ago and now noticed two new ones. Also has one under left arm that "popped" this am.

## 2011-02-21 NOTE — ED Notes (Signed)
Pt is 4 months pregnant.

## 2011-02-21 NOTE — ED Provider Notes (Signed)
History     Chief Complaint  Patient presents with  . Abscess   HPI Comments: Patient has had multiple small abscesses in the right axilla the largest of which which has drained, also has isolated abscess in the left axilla which drained spontaneously. She took one of her mother's Percocet last night which helped the pain.  Patient is a 25 y.o. female presenting with abscess. The history is provided by the patient.  Abscess  This is a new problem. The current episode started more than one week ago. The onset was gradual. The problem occurs continuously. Progression since onset: Improved. Affected Location: Right axilla. The problem is moderate. The abscess is characterized by redness, painfulness and draining. Associated with: Has been using her razor with somebody else's deodorant who has known MRSA. The abscess first occurred at home. Pertinent negatives include no anorexia, not sleeping less, no fever, no vomiting and no cough.    History reviewed. No pertinent past medical history.  Past Surgical History  Procedure Date  . Cesarean section     No family history on file.  History  Substance Use Topics  . Smoking status: Current Everyday Smoker  . Smokeless tobacco: Not on file  . Alcohol Use: No    OB History    Grav Para Term Preterm Abortions TAB SAB Ect Mult Living   1               Review of Systems  Constitutional: Negative for fever and chills.  Respiratory: Negative for cough.   Gastrointestinal: Negative for nausea, vomiting and anorexia.  Skin: Positive for rash.       abscess    Physical Exam  BP 98/61  Pulse 88  Temp(Src) 98.5 F (36.9 C) (Oral)  Resp 16  Ht 5\' 6"  (1.676 m)  Wt 119 lb 14.4 oz (54.386 kg)  BMI 19.35 kg/m2  SpO2 100%  LMP 08/25/2010  Physical Exam  Constitutional: She appears well-developed and well-nourished. No distress.  HENT:  Head: Normocephalic and atraumatic.  Eyes: Conjunctivae are normal. Left eye exhibits no discharge.  No scleral icterus.  Cardiovascular: Normal rate and regular rhythm.   No murmur heard. Pulmonary/Chest: Effort normal and breath sounds normal.  Musculoskeletal: She exhibits no edema and no tenderness.  Skin: Skin is warm and dry. She is not diaphoretic.       Right axilla with 4 small 2-3 mm erythematous areas. One half centimeter area with central tenderness. Left axilla with 2 mm erythematous drained abscess. This area is minimally tender.    ED Course  Procedures  MDM I have discussed with patient at length the treatment for these very small abscesses. She has declined incision and drainage at this time has elected for antibiotic therapy and warm soaks. Vital signs are stable without significant hypotension, fever, tachycardia. The patient admits to being 4 months pregnant thus we'll treat with clindamycin. She is aware and has expressed her understanding of the need for followup      Vida Roller, MD 02/21/11 1325

## 2011-04-08 ENCOUNTER — Encounter (HOSPITAL_BASED_OUTPATIENT_CLINIC_OR_DEPARTMENT_OTHER): Payer: Self-pay | Admitting: *Deleted

## 2011-04-08 ENCOUNTER — Emergency Department (HOSPITAL_BASED_OUTPATIENT_CLINIC_OR_DEPARTMENT_OTHER)
Admission: EM | Admit: 2011-04-08 | Discharge: 2011-04-08 | Disposition: A | Discharge status: Home / Self Care | Payer: Medicaid Other | Attending: Emergency Medicine | Admitting: Emergency Medicine

## 2011-04-08 DIAGNOSIS — L02419 Cutaneous abscess of limb, unspecified: Secondary | ICD-10-CM

## 2011-04-08 DIAGNOSIS — IMO0002 Reserved for concepts with insufficient information to code with codable children: Secondary | ICD-10-CM | POA: Insufficient documentation

## 2011-04-08 DIAGNOSIS — F17200 Nicotine dependence, unspecified, uncomplicated: Secondary | ICD-10-CM | POA: Insufficient documentation

## 2011-04-08 MED ORDER — CLINDAMYCIN HCL 150 MG PO CAPS: 300.0000 mg | ORAL_CAPSULE | Freq: Three times a day (TID) | ORAL | Status: AC

## 2011-04-08 NOTE — ED Provider Notes (Signed)
History     CSN: [id number] Arrival date & time: 04/08/2011  1:35 PM   Chief Complaint  Patient presents with  . Abscess     (Include location/radiation/quality/duration/timing/severity/associated sxs/prior treatment) HPI Comments: Pt has a history of similar abscess 2 months ago and was treated with clindamycin and has not had a problem since:pt states that she is also pregnant   Patient is a 25 y.o. female presenting with abscess. The history is provided by the patient. No language interpreter was used.  Abscess  This is a new problem. The current episode started yesterday. The problem occurs continuously. The problem has been gradually worsening. The abscess is present on the left arm. The abscess is characterized by painfulness and redness. It is unknown what she was exposed to. The abscess first occurred at home. Her past medical history is significant for skin abscesses in family. There were no sick contacts. Recently, medical care has been given at this facility.     History reviewed. No pertinent past medical history.   Past Surgical History  Procedure Date  . Cesarean section     History reviewed. No pertinent family history.  History  Substance Use Topics  . Smoking status: Current Everyday Smoker  . Smokeless tobacco: Not on file  . Alcohol Use: No    OB History    Grav Para Term Preterm Abortions TAB SAB Ect Mult Living   1               Review of Systems  Constitutional: Negative.   Respiratory: Negative.   Cardiovascular: Negative.   Skin:       C/o multiple red area to the left arm  Neurological: Negative.     Allergies  Review of patient's allergies indicates no known allergies.  Home Medications   Current Outpatient Rx  Name Route Sig Dispense Refill  . AMOXICILLIN 500 MG PO CAPS Oral Take 500 mg by mouth 3 (three) times daily.        Physical Exam    BP 106/65  Pulse 82  Temp(Src) 98.2 F (36.8 C) (Oral)  Resp 18  SpO2 100%  LMP  08/25/2010  Physical Exam  Nursing note and vitals reviewed. Constitutional: She appears well-developed and well-nourished.  Cardiovascular: Normal rate and regular rhythm.   Pulmonary/Chest: Effort normal and breath sounds normal.  Musculoskeletal: Normal range of motion.  Neurological: She is alert.  Skin:       Pt has 2 localized area to the left arm that a each .5 cm in diameter on in the axilla and one in upper left arm    ED Course  Procedures   No results found.   No diagnosis found.   MDM Don't think any I&D is needed at this time:will treat with clindamycin due to pregnancy       Teressa Lower, NP 04/08/11 1404

## 2011-04-08 NOTE — ED Notes (Signed)
Pt states she has a hx of abscesses. Now has 2. Was seen here about a month ago for same. Took antibx and got better.

## 2011-04-10 NOTE — ED Provider Notes (Signed)
History/physical exam/procedure(s) were performed by non-physician practitioner and as supervising physician I was immediately available for consultation/collaboration. I have reviewed all notes and am in agreement with care and plan.  Hilario Quarry, MD 04/10/11 1210

## 2011-04-16 LAB — CBC
HCT: 37.3
MCHC: 34.9
Platelets: 245
Platelets: 279
RDW: 12.2
RDW: 12.3
WBC: 11.3 — ABNORMAL HIGH

## 2011-04-16 LAB — ABO/RH: ABO/RH(D): O POS

## 2011-04-16 LAB — WET PREP, GENITAL
Trich, Wet Prep: NONE SEEN
Yeast Wet Prep HPF POC: NONE SEEN

## 2011-04-16 LAB — GC/CHLAMYDIA PROBE AMP, GENITAL
Chlamydia, DNA Probe: NEGATIVE
GC Probe Amp, Genital: NEGATIVE

## 2011-04-16 LAB — HCG, QUANTITATIVE, PREGNANCY: hCG, Beta Chain, Quant, S: 13627 — ABNORMAL HIGH

## 2011-04-18 ENCOUNTER — Inpatient Hospital Stay (HOSPITAL_COMMUNITY)
Admission: AD | Admit: 2011-04-18 | Discharge: 2011-04-18 | Disposition: A | Discharge status: Home / Self Care | Payer: Medicaid Other | Source: Ambulatory Visit | Attending: Family Medicine | Admitting: Family Medicine

## 2011-04-18 ENCOUNTER — Encounter (HOSPITAL_COMMUNITY): Payer: Self-pay

## 2011-04-18 ENCOUNTER — Inpatient Hospital Stay (HOSPITAL_COMMUNITY): Payer: Medicaid Other

## 2011-04-18 DIAGNOSIS — A6000 Herpesviral infection of urogenital system, unspecified: Secondary | ICD-10-CM | POA: Insufficient documentation

## 2011-04-18 DIAGNOSIS — Y92009 Unspecified place in unspecified non-institutional (private) residence as the place of occurrence of the external cause: Secondary | ICD-10-CM | POA: Insufficient documentation

## 2011-04-18 DIAGNOSIS — O99891 Other specified diseases and conditions complicating pregnancy: Secondary | ICD-10-CM | POA: Insufficient documentation

## 2011-04-18 DIAGNOSIS — R109 Unspecified abdominal pain: Secondary | ICD-10-CM | POA: Insufficient documentation

## 2011-04-18 DIAGNOSIS — W108XXA Fall (on) (from) other stairs and steps, initial encounter: Secondary | ICD-10-CM | POA: Insufficient documentation

## 2011-04-18 HISTORY — DX: Herpesviral infection of urogenital system, unspecified: A60.00

## 2011-04-18 LAB — RAPID URINE DRUG SCREEN, HOSP PERFORMED
Barbiturates: NOT DETECTED
Benzodiazepines: NOT DETECTED
Cocaine: NOT DETECTED
Tetrahydrocannabinol: NOT DETECTED

## 2011-04-18 LAB — URINALYSIS, ROUTINE W REFLEX MICROSCOPIC
Bilirubin Urine: NEGATIVE
Leukocytes, UA: NEGATIVE
Nitrite: NEGATIVE
Specific Gravity, Urine: 1.01 (ref 1.005–1.030)
Urobilinogen, UA: 0.2 mg/dL (ref 0.0–1.0)

## 2011-04-18 LAB — CBC
HCT: 31.5 % — ABNORMAL LOW (ref 36.0–46.0)
Hemoglobin: 10.7 g/dL — ABNORMAL LOW (ref 12.0–15.0)
MCHC: 34 g/dL (ref 30.0–36.0)
MCV: 97.2 fL (ref 78.0–100.0)

## 2011-04-18 LAB — URINE MICROSCOPIC-ADD ON

## 2011-04-18 LAB — KLEIHAUER-BETKE STAIN: Quantitation Fetal Hemoglobin: 0 mL

## 2011-04-18 LAB — WET PREP, GENITAL

## 2011-04-18 MED ORDER — ACETAMINOPHEN 500 MG PO TABS
1000.0000 mg | ORAL_TABLET | Freq: Once | ORAL | Status: AC
  Administered 2011-04-18: 1000 mg via ORAL

## 2011-04-18 NOTE — ED Notes (Signed)
Pt. Voiced concerns about staying for results and further EFM. States she must pick up her children at 43. Dr. Natale Milch present and aware. Dr. Natale Milch explained reasons for further EFM and reviewing results prior to D/C. Pt. States she must go. Pt. Removed EFM. Signed AMA form.

## 2011-04-18 NOTE — ED Notes (Signed)
Pt. C/O soreness in R upper thigh and hip and L rib area after fall. No abrasion or bruising noted. Pt. Has not begun [facility], states is still waiting for medicaid card. Also C/O  pain in substernal and midline chest area. States has been on going. Was planning to come to hospital when she fell on steps.

## 2011-04-18 NOTE — Progress Notes (Signed)
Pt states she has been having epigastric pain that shoots up that started 2 nights ago, comes and goes. Was on her way to the hospital and fell down 3 stairs and hit the right side of her body, now having pain on the right side. States she had not felt the baby since the accident about one hour ago. No leaking or bleeding but has had a heavier discharge.

## 2011-04-18 NOTE — Progress Notes (Signed)
Pt states she has not had any prenatal care, waiting for Medicaid.

## 2011-04-18 NOTE — ED Notes (Signed)
Pt. Given MAU # to call for results. Also given clinic number by Dr. Natale Milch who was calling for appointment for patient to begin prenatal care.

## 2011-04-18 NOTE — ED Provider Notes (Signed)
History   Patient is 25-yo [id number] with history of 2 previous c-sections who presents to [facility]U having history of no prenatal care and a fall around 1 PM today that she hit the RT side of her stomach going down 3 stairs. She states the pain has gotten better since coming to [facility]U and getting tylenol. She has no other medical or surgical history. She states she has been having LT sided pain that bores into her back with some nausea but no vomiting, has had no diarrhea or fevers. No sick contacts. She has not had problems with reflux, gall bladder or stomach ulcers in the past. She cannot stay for 4 hours of monitoring due to child care issues.  Chief Complaint  Patient presents with  . Fall   HPI   Past Medical History  Diagnosis Date  . Herpes genitalia     Past Surgical History  Procedure Date  . Cesarean section     No family history on file.  History  Substance Use Topics  . Smoking status: Current Everyday Smoker -- 0.2 packs/day  . Smokeless tobacco: Never Used  . Alcohol Use: No    Allergies: No Known Allergies  Prescriptions prior to admission  Medication Sig Dispense Refill  . acetaminophen (TYLENOL) 325 MG tablet Take 650 mg by mouth every 6 (six) hours as needed. For pain       . prenatal vitamin w/FE, FA (PRENATAL 1 + 1) 27-1 MG TABS Take 1 tablet by mouth daily.        Marland Kitchen amoxicillin (AMOXIL) 500 MG capsule Take 500 mg by mouth 3 (three) times daily.       . clindamycin (CLEOCIN) 150 MG capsule Take 2 capsules (300 mg total) by mouth 3 (three) times daily.  21 capsule  0    Review of Systems  Constitutional: Negative for fever and chills.  Eyes: Negative for blurred vision.  Respiratory: Negative for cough and wheezing.   Cardiovascular: Positive for chest pain. Negative for palpitations.  Gastrointestinal: Positive for nausea and abdominal pain. Negative for heartburn, vomiting, diarrhea, constipation and blood in stool.  Genitourinary: Negative for dysuria,  urgency and flank pain.  Musculoskeletal: Negative for myalgias.  Skin: Negative for itching and rash.  Neurological: Negative for headaches.   Physical Exam   Blood pressure 104/55, pulse 96, temperature 99.2 F (37.3 C), temperature source Oral, resp. rate 16, height 5\' 5"  (1.651 m), weight 128 lb 12.8 oz (58.423 kg), last menstrual period 10/12/2010, SpO2 99.00%.  Physical Exam  Constitutional: She is oriented to person, place, and time. She appears well-developed and well-nourished. No distress.  HENT:  Head: Normocephalic.  Eyes: Pupils are equal, round, and reactive to light.  Neck: Normal range of motion.  Cardiovascular: Normal rate, regular rhythm, normal heart sounds and intact distal pulses.  Exam reveals no gallop and no friction rub.   No murmur heard. Respiratory: Effort normal and breath sounds normal. No respiratory distress. She has no wheezes. She has no rales. She exhibits no tenderness.  GI: Soft. Bowel sounds are normal. She exhibits no distension. There is tenderness. There is no rebound and no guarding.       Tenderness along the RT side of her upper uterine outline, as well as just Left of the epigastrium. No flank tenderness.  Genitourinary: Vaginal discharge found.       Milky white vaginal discharge seen. No bleeding. Cervix closed on visual exam and digital exam. NO CMT  Musculoskeletal: Normal range  of motion.  Neurological: She is alert and oriented to person, place, and time.  Skin: Skin is warm and dry. No rash noted. She is not diaphoretic. No erythema.  Psychiatric: She has a normal mood and affect.    MAU Course  Procedures [place]: Normal fetal anatomy, no placental abnormality. No abruption.  LABS: pending Assessment and Plan  IUP at [redacted] wks EGA, s/p fall. Pt signed out AMA, and states she had to get her children. She states she will call back for results, and will call [facility] for an appointment to being her prenatal care. Fetal monitoring shows  appropriate tracing for [redacted] week gestation, no contractions seen.  Chancy Milroy 04/18/2011, 4:48 PM

## 2011-04-19 ENCOUNTER — Encounter: Payer: Self-pay | Admitting: Obstetrics & Gynecology

## 2011-04-19 LAB — DIFFERENTIAL
Basophils Absolute: 0
Lymphocytes Relative: 4 — ABNORMAL LOW
Neutro Abs: 14.7 — ABNORMAL HIGH

## 2011-04-19 LAB — COMPREHENSIVE METABOLIC PANEL
ALT: 6 U/L (ref 0–35)
Alkaline Phosphatase: 75 U/L (ref 39–117)
CO2: 24 mEq/L (ref 19–32)
Glucose, Bld: 80 mg/dL (ref 70–99)
Potassium: 3.9 mEq/L (ref 3.5–5.1)
Sodium: 135 mEq/L (ref 135–145)

## 2011-04-19 LAB — CBC
Platelets: 182
RDW: 12.4

## 2011-04-19 LAB — POCT I-STAT, CHEM 8
Calcium, Ion: 1.17
Glucose, Bld: 106 — ABNORMAL HIGH
HCT: 40
Hemoglobin: 13.6
Potassium: 3.5
TCO2: 22

## 2011-04-19 LAB — URINALYSIS, ROUTINE W REFLEX MICROSCOPIC
Ketones, ur: NEGATIVE
Nitrite: POSITIVE — AB
Specific Gravity, Urine: 1.016
pH: 6.5

## 2011-04-19 LAB — URINE MICROSCOPIC-ADD ON

## 2011-04-19 LAB — URINE CULTURE: Colony Count: >=100000

## 2011-04-19 LAB — POCT PREGNANCY, URINE: Preg Test, Ur: NEGATIVE

## 2011-04-23 LAB — WET PREP, GENITAL: Clue Cells Wet Prep HPF POC: NONE SEEN

## 2011-04-23 LAB — URINALYSIS, ROUTINE W REFLEX MICROSCOPIC
Bilirubin Urine: NEGATIVE
Ketones, ur: NEGATIVE
Nitrite: NEGATIVE
Protein, ur: NEGATIVE
Specific Gravity, Urine: 1.02
Urobilinogen, UA: 0.2

## 2011-04-24 LAB — GC/CHLAMYDIA PROBE AMP, GENITAL: Chlamydia, DNA Probe: NEGATIVE

## 2011-04-24 LAB — URINALYSIS, ROUTINE W REFLEX MICROSCOPIC
Bilirubin Urine: NEGATIVE
Nitrite: NEGATIVE
Specific Gravity, Urine: >1.03 — ABNORMAL HIGH
Urobilinogen, UA: 0.2

## 2011-04-24 LAB — WET PREP, GENITAL

## 2011-04-24 LAB — CBC
HCT: 31.3 — ABNORMAL LOW
Hemoglobin: 11.1 — ABNORMAL LOW
RDW: 13.1
WBC: 10.1

## 2011-04-27 LAB — URINE CULTURE
Colony Count: 3000
Special Requests: NEGATIVE

## 2011-04-27 LAB — DIFFERENTIAL
Basophils Relative: 0 % (ref 0–1)
Lymphs Abs: 1.3 10*3/uL (ref 0.7–4.0)
Monocytes Relative: 5 % (ref 3–12)
Neutro Abs: 11.3 10*3/uL — ABNORMAL HIGH (ref 1.7–7.7)
Neutrophils Relative %: 85 % — ABNORMAL HIGH (ref 43–77)

## 2011-04-27 LAB — CBC
HCT: 32.5 % — ABNORMAL LOW (ref 36.0–46.0)
Hemoglobin: 11.2 g/dL — ABNORMAL LOW (ref 12.0–15.0)
MCHC: 34.5 g/dL (ref 30.0–36.0)
MCV: 97.4 fL (ref 78.0–100.0)
RDW: 13.5 % (ref 11.5–15.5)

## 2011-04-27 LAB — COMPREHENSIVE METABOLIC PANEL
BUN: 4 mg/dL — ABNORMAL LOW (ref 6–23)
Calcium: 9.2 mg/dL (ref 8.4–10.5)
Glucose, Bld: 85 mg/dL (ref 70–99)
Total Protein: 6.6 g/dL (ref 6.0–8.3)

## 2011-05-01 LAB — POCT PREGNANCY, URINE: Preg Test, Ur: NEGATIVE

## 2011-05-09 ENCOUNTER — Encounter: Payer: Self-pay | Admitting: Family Medicine

## 2011-05-24 ENCOUNTER — Inpatient Hospital Stay (HOSPITAL_COMMUNITY): Payer: Medicaid Other

## 2011-05-24 ENCOUNTER — Observation Stay (HOSPITAL_COMMUNITY)
Admission: AD | Admit: 2011-05-24 | Discharge: 2011-05-24 | DRG: 782 | Disposition: A | Discharge status: Home / Self Care | Payer: Medicaid Other | Source: Ambulatory Visit | Attending: Obstetrics & Gynecology | Admitting: Obstetrics & Gynecology

## 2011-05-24 ENCOUNTER — Encounter (HOSPITAL_COMMUNITY): Payer: Self-pay

## 2011-05-24 DIAGNOSIS — O4290 Premature rupture of membranes, unspecified as to length of time between rupture and onset of labor, unspecified weeks of gestation: Principal | ICD-10-CM | POA: Diagnosis present

## 2011-05-24 DIAGNOSIS — O34219 Maternal care for unspecified type scar from previous cesarean delivery: Secondary | ICD-10-CM | POA: Diagnosis present

## 2011-05-24 DIAGNOSIS — O42919 Preterm premature rupture of membranes, unspecified as to length of time between rupture and onset of labor, unspecified trimester: Secondary | ICD-10-CM

## 2011-05-24 DIAGNOSIS — O0930 Supervision of pregnancy with insufficient antenatal care, unspecified trimester: Secondary | ICD-10-CM

## 2011-05-24 DIAGNOSIS — A6000 Herpesviral infection of urogenital system, unspecified: Secondary | ICD-10-CM

## 2011-05-24 LAB — WET PREP, GENITAL
Trich, Wet Prep: NONE SEEN
Yeast Wet Prep HPF POC: NONE SEEN

## 2011-05-24 LAB — AMNISURE RUPTURE OF MEMBRANE (ROM) NOT AT ARMC: Amnisure ROM: NEGATIVE

## 2011-05-24 LAB — CBC
HCT: 33.4 % — ABNORMAL LOW (ref 36.0–46.0)
Hemoglobin: 11.2 g/dL — ABNORMAL LOW (ref 12.0–15.0)
MCH: 32.7 pg (ref 26.0–34.0)
MCHC: 33.5 g/dL (ref 30.0–36.0)
MCV: 97.4 fL (ref 78.0–100.0)
RBC: 3.43 MIL/uL — ABNORMAL LOW (ref 3.87–5.11)

## 2011-05-24 LAB — DIFFERENTIAL
Basophils Absolute: 0 10*3/uL (ref 0.0–0.1)
Eosinophils Relative: 1 % (ref 0–5)

## 2011-05-24 LAB — URINALYSIS, ROUTINE W REFLEX MICROSCOPIC
Bilirubin Urine: NEGATIVE
Glucose, UA: NEGATIVE mg/dL
Specific Gravity, Urine: 1.01 (ref 1.005–1.030)
Urobilinogen, UA: 0.2 mg/dL (ref 0.0–1.0)

## 2011-05-24 LAB — RAPID HIV SCREEN (WH-MAU): Rapid HIV Screen: NONREACTIVE

## 2011-05-24 LAB — URINE MICROSCOPIC-ADD ON

## 2011-05-24 MED ORDER — ACETAMINOPHEN 325 MG PO TABS: 650.0000 mg | ORAL_TABLET | ORAL | Status: DC | PRN

## 2011-05-24 MED ORDER — DEXTROSE 5 % IV SOLN
500.0000 mg | INTRAVENOUS | Status: DC
  Filled 2011-05-24: qty 500

## 2011-05-24 MED ORDER — LACTATED RINGERS IV SOLN: INTRAVENOUS | Status: DC

## 2011-05-24 MED ORDER — BETAMETHASONE SOD PHOS & ACET 6 (3-3) MG/ML IJ SUSP
12.0000 mg | INTRAMUSCULAR | Status: DC
  Filled 2011-05-24: qty 2

## 2011-05-24 MED ORDER — SODIUM CHLORIDE 0.9 % IV SOLN
2.0000 g | Freq: Four times a day (QID) | INTRAVENOUS | Status: DC
  Filled 2011-05-24 (×2): qty 2000

## 2011-05-24 MED ORDER — DOCUSATE SODIUM 100 MG PO CAPS: 100.0000 mg | ORAL_CAPSULE | Freq: Every day | ORAL | Status: DC

## 2011-05-24 MED ORDER — PRENATAL PLUS 27-1 MG PO TABS: 1.0000 | ORAL_TABLET | Freq: Every day | ORAL | Status: DC

## 2011-05-24 MED ORDER — MAGNESIUM SULFATE BOLUS VIA INFUSION
4.0000 g | Freq: Once | INTRAVENOUS | Status: DC
  Filled 2011-05-24: qty 500

## 2011-05-24 MED ORDER — CALCIUM CARBONATE ANTACID 500 MG PO CHEW: 2.0000 | CHEWABLE_TABLET | ORAL | Status: DC | PRN

## 2011-05-24 MED ORDER — AMOXICILLIN 500 MG PO CAPS: 500.0000 mg | ORAL_CAPSULE | Freq: Three times a day (TID) | ORAL | Status: DC

## 2011-05-24 MED ORDER — AZITHROMYCIN 250 MG PO TABS: 500.0000 mg | ORAL_TABLET | Freq: Every day | ORAL | Status: DC

## 2011-05-24 MED ORDER — ZOLPIDEM TARTRATE 10 MG PO TABS: 10.0000 mg | ORAL_TABLET | Freq: Every evening | ORAL | Status: DC | PRN

## 2011-05-24 MED ORDER — MAGNESIUM SULFATE 40 G IN LACTATED RINGERS - SIMPLE
2.0000 g/h | INTRAVENOUS | Status: DC
  Filled 2011-05-24: qty 500

## 2011-05-24 NOTE — Progress Notes (Signed)
U/S shows AFI of 14.58, normal length cervix, and Amnisure is negative.  Pt will be discharged.  She has an appt in [facility] 05/30/11, but we will go ahead and draw routine labs

## 2011-05-24 NOTE — Progress Notes (Signed)
Been having painful contractions, started yesterday.  For a few days feels like leaking, clear thick, gooy d/c noted when uses restroom.

## 2011-05-24 NOTE — Discharge Summary (Signed)
  Obstetric Discharge Summary Reason for Admission: R/O ROM Prenatal Procedures: none Intrapartum Procedures: N/A Postpartum Procedures: N/A Complications-Operative and Postpartum: N/A   This patient has no babies on file.  H/H: Lab Results  Component Value Date/Time   HGB 10.7* 04/18/2011  4:26 PM   HCT 31.5* 04/18/2011  4:26 PM      Discharge Diagnoses: No ROM; No PNC Patient was admitted after concern about possible PPROM but had AFI of 14.5, amnisure negative. She was sent home with precautions.  Discharge Information: Date: 02/01/2011 Activity: routine Diet: routine  Medications: PNV Breast feeding: N/A Condition: stable Instructions: refer to practice specific booklet Discharge to: home   Discharge Orders    Future Appointments: Provider: Department: Dept Phone: Center:   05/30/2011 9:45 AM [facility] [phone] [facility]     Current Discharge Medication List    CONTINUE these medications which have NOT CHANGED   Details  acetaminophen (TYLENOL) 325 MG tablet Take 650 mg by mouth every 6 (six) hours as needed. For pain     prenatal vitamin w/FE, FA (PRENATAL 1 + 1) 27-1 MG TABS Take 1 tablet by mouth daily.        STOP taking these medications     amoxicillin (AMOXIL) 500 MG capsule          CRESENZO-DISHMAN,FRANCES 05/24/2011,6:34 PM  Agree with above. ANYANWU,UGONNA A 05/24/2011 7:14 PM

## 2011-05-24 NOTE — H&P (Signed)
Haley Wiley is a 25 y.o. female presenting for premature preterm rupture of membranes 05/20/11. Maternal Medical History:  Reason for admission: Reason for admission: rupture of membranes.  Fetal activity: Perceived fetal activity is normal.   Last perceived fetal movement was within the past hour.    Prenatal complications: No bleeding or substance abuse.   Polyhydramnios: arnold.     OB History    Grav Para Term Preterm Abortions TAB SAB Ect Mult Living   4 2 2  0 1 0 1 0 0 2     Past Medical History  Diagnosis Date  . Herpes genitalia     last outbreak early 2012   Past Surgical History  Procedure Date  . Cesarean section    Family History: family history is not on file. Social History:  reports that she has been smoking.  She has never used smokeless tobacco. She reports that she does not drink alcohol or use illicit drugs.  Review of Systems  Constitutional: Negative.   Respiratory: Negative.   Cardiovascular: Negative.   Gastrointestinal: Negative.   Genitourinary: Negative.     Dilation: Closed Effacement (%): Thick Exam by:: W Muhammad CNM Blood pressure 109/55, pulse 84, temperature 98.7 F (37.1 C), temperature source Oral, resp. rate 20, height 5\' 4"  (1.626 m), weight 59.421 kg (131 lb), last menstrual period 10/12/2010, SpO2 99.00%. Maternal Exam:  Introitus: Vagina is positive for vaginal discharge (mucusy; no pooling noted; thick DC).    Physical Exam  Constitutional: She is oriented to person, place, and time. She appears well-developed and well-nourished.  HENT:  Head: Normocephalic.  Neck: Normal range of motion. Neck supple.  Cardiovascular: Normal rate, regular rhythm and normal heart sounds.   Respiratory: Effort normal and breath sounds normal.  GI: Soft. There is no tenderness.  Genitourinary: There is no lesion on the right labia. There is no lesion Crist Fat +) on the left labia. No bleeding around the vagina. Vaginal discharge (mucusy; no  pooling noted; thick DC) found.  Neurological: She is alert and oriented to person, place, and time.  Skin: Skin is warm and dry.    Prenatal labs: ABO, Rh:   Antibody:   Rubella:   RPR:    HBsAg:    HIV:    GBS:     Assessment/Plan: Premature Preterm Rupture of Membranes Genital Herpes - no lesions Previous C-Section x 2  Plan: Admit to Antenatal IV antibiotics prophylaxis Betamethasone Mag Sulfate for CP prophylaxis Labs:  Wet prep, gc/ct, prenatal panel, +HIV, GBS\ Complete OB ultrasound [facility]   [facility] 05/24/2011, 3:51 PM

## 2011-05-24 NOTE — ED Provider Notes (Signed)
Chief Complaint:  Abdominal Pain   Haley Wiley is  25 y.o. [id number].  Patient's last menstrual period was 10/12/2010.Marland Kitchen  Her pregnancy status is positive.  She presents complaining of Abdominal Pain and vaginal discharge.  States she began to notice an increased feeling of wetness requiring her to wipe extra after urination and change her underwear 4-5x per day.  Onset is described as gradual and has been present for  3-4 days.  Describes the discharge as clear, non malodorous.  Denies vaginal itching or pain, bleeding, dysuria, urinary frequency, CP, SOB, edema, fever or chills.  She states feeling a change in her abd pain yesterday with it becoming more intense.  States pain is down low and radiates into her hips.  Worse with fetal movement.  Good fetal movement for the last week.  Last sexual intercourse was 2 days ago.  No Hx of preterm ROM or labor.    Obstetrical/Gynecological History: OB History    Grav Para Term Preterm Abortions TAB SAB Ect Mult Living   4 2 2  0 1 0 1 0 0 2      Past Medical History: Past Medical History  Diagnosis Date  . Herpes genitalia     last outbreak early 2012    Past Surgical History: Past Surgical History  Procedure Date  . Cesarean section     Family History: No family history on file.  Social History: History  Substance Use Topics  . Smoking status: Current Everyday Smoker -- 0.2 packs/day  . Smokeless tobacco: Never Used  . Alcohol Use: No    Allergies: No Known Allergies  Prescriptions prior to admission  Medication Sig Dispense Refill  . acetaminophen (TYLENOL) 325 MG tablet Take 650 mg by mouth every 6 (six) hours as needed. For pain       . amoxicillin (AMOXIL) 500 MG capsule Take 500 mg by mouth 3 (three) times daily.       . prenatal vitamin w/FE, FA (PRENATAL 1 + 1) 27-1 MG TABS Take 1 tablet by mouth daily.          Review of Systems - Negative except as noted in the HPI  Physical Exam   Blood pressure 101/61, pulse  82, temperature 98 F (36.7 C), temperature source Oral, resp. rate 20, height 5\' 4"  (1.626 m), weight 59.421 kg (131 lb), last menstrual period 10/12/2010, SpO2 99.00%.  General: General appearance - alert, well appearing, and in no distress Lymphatics - no palpable lymphadenopathy Chest - clear to auscultation, no wheezes, rales or rhonchi, symmetric air entry Heart - normal rate, regular rhythm, normal S1, S2, no murmurs, rubs, clicks or gallops Abdomen - gravid uterus, firm, non tender to palpation Pelvic - VULVA: normal appearing vulva with no masses, tenderness or lesions, VAGINA: normal appearing vagina with normal color, no lesions, vaginal discharge - white, copious, creamy and odorless, CERVIX: cervical discharge present - white, copious and creamy, WET MOUNT done; DNA probe for chlamydia and GC obtained, cervical motion tenderness absent, cervical os closed on manual exam Extremities - peripheral pulses normal, no pedal edema, no clubbing or cyanosis  Labs: Recent Results (from the past 24 hour(s))  URINALYSIS, ROUTINE W REFLEX MICROSCOPIC   Collection Time   05/24/11  1:55 PM      Component Value Range   Color, Urine YELLOW  YELLOW    Appearance CLEAR  CLEAR    Specific Gravity, Urine 1.010  1.005 - 1.030    pH 7.0  5.0 -  8.0    Glucose, UA NEGATIVE  NEGATIVE (mg/dL)   Hgb urine dipstick TRACE (*) NEGATIVE    Bilirubin Urine NEGATIVE  NEGATIVE    Ketones, ur NEGATIVE  NEGATIVE (mg/dL)   Protein, ur NEGATIVE  NEGATIVE (mg/dL)   Urobilinogen, UA 0.2  0.0 - 1.0 (mg/dL)   Nitrite NEGATIVE  NEGATIVE    Leukocytes, UA NEGATIVE  NEGATIVE   URINE MICROSCOPIC-ADD ON   Collection Time   05/24/11  1:55 PM      Component Value Range   Squamous Epithelial / LPF RARE  RARE    RBC / HPF 0-2  <3 (RBC/hpf)  WET PREP, GENITAL   Collection Time   05/24/11  2:24 PM      Component Value Range   Yeast, Wet Prep NONE SEEN  NONE SEEN    Trich, Wet Prep NONE SEEN  NONE SEEN    Clue Cells,  Wet Prep NONE SEEN  NONE SEEN    WBC, Wet Prep HPF POC NONE SEEN  NONE SEEN    Ferning of posterior cervical fluid noted on microscopic exam  Imaging Studies:  [place] to asses amt of amniotic fluid:  Fetal presentation/l transverse, head to maternal L  Amniotic Fluid: subjectively WNL   RUQ: 4.4cm, RLQ: 2.51cm, LUQ: 5.14, LLQ: 2.52   AFI sum: 14.58; 50%tile  Est FW: 1410g, 54%tile   Assessment: Patient Active Problem List  Diagnoses  . Herpes genitalia  1. PROM  Plan: 1. Admit to Antenatal with PROM orders  Dahlia Client Emonii Wienke 05/24/2011,6:16 PM

## 2011-05-25 LAB — GC/CHLAMYDIA PROBE AMP, GENITAL: Chlamydia, DNA Probe: NEGATIVE

## 2011-05-25 NOTE — ED Provider Notes (Signed)
I have seen and examined pt and agree with note above. [facility]

## 2011-05-27 LAB — CULTURE, BETA STREP (GROUP B ONLY)

## 2011-05-30 ENCOUNTER — Other Ambulatory Visit: Payer: Self-pay | Admitting: Advanced Practice Midwife

## 2011-05-30 ENCOUNTER — Other Ambulatory Visit (HOSPITAL_COMMUNITY)
Admission: RE | Admit: 2011-05-30 | Discharge: 2011-05-30 | Disposition: A | Discharge status: Home / Self Care | Payer: Medicaid Other | Source: Ambulatory Visit | Attending: Family Medicine | Admitting: Family Medicine

## 2011-05-30 ENCOUNTER — Ambulatory Visit (INDEPENDENT_AMBULATORY_CARE_PROVIDER_SITE_OTHER): Payer: Medicaid Other | Admitting: Family Medicine

## 2011-05-30 DIAGNOSIS — Z331 Pregnant state, incidental: Secondary | ICD-10-CM

## 2011-05-30 DIAGNOSIS — A6000 Herpesviral infection of urogenital system, unspecified: Secondary | ICD-10-CM | POA: Insufficient documentation

## 2011-05-30 DIAGNOSIS — Z23 Encounter for immunization: Secondary | ICD-10-CM

## 2011-05-30 DIAGNOSIS — Z01419 Encounter for gynecological examination (general) (routine) without abnormal findings: Secondary | ICD-10-CM | POA: Insufficient documentation

## 2011-05-30 DIAGNOSIS — Z113 Encounter for screening for infections with a predominantly sexual mode of transmission: Secondary | ICD-10-CM | POA: Insufficient documentation

## 2011-05-30 DIAGNOSIS — Z1159 Encounter for screening for other viral diseases: Secondary | ICD-10-CM | POA: Insufficient documentation

## 2011-05-30 DIAGNOSIS — Z3480 Encounter for supervision of other normal pregnancy, unspecified trimester: Secondary | ICD-10-CM

## 2011-05-30 LAB — POCT URINALYSIS DIP (DEVICE)
Bilirubin Urine: NEGATIVE
Leukocytes, UA: NEGATIVE
Nitrite: NEGATIVE
pH: 7 (ref 5.0–8.0)

## 2011-05-30 MED ORDER — INFLUENZA VIRUS VACC SPLIT PF IM SUSP: 0.5000 mL | Freq: Once | INTRAMUSCULAR | Status: DC

## 2011-05-30 MED ORDER — TETANUS-DIPHTH-ACELL PERTUSSIS 5-2.5-18.5 LF-MCG/0.5 IM SUSP: 0.5000 mL | Freq: Once | INTRAMUSCULAR | Status: DC

## 2011-05-30 NOTE — Progress Notes (Signed)
Pulse- 85.  Pain-legs, hips, sides.  Pressure-vaginal.  Clear vaginal discharge.  Pt went MAU last Thursday for "leakage"

## 2011-05-30 NOTE — Progress Notes (Signed)
Patient seen for initial OB.  No concerns.  Would like repeat c-section - 30 day papers signed.   1 hr GTT done today  Vitals reviewed - as above. Exam: Head - [place]/AT Thyroid - normal Heart: RR, no murmur Lungs: CTA Abd: soft, NT Vaginal exam: white discharge, external genitalia normal, no lesions noted. Ext: no edema  PAP done, will send wet prep, gc/chlamydia Follow up in 2 weeks. Will need to schedule c-section.

## 2011-05-30 NOTE — Assessment & Plan Note (Signed)
Needs prophylaxis with valtrex at 36 weeks

## 2011-05-30 NOTE — Progress Notes (Signed)
Addended by: Sherre Lain A on: 05/30/2011 11:56 AM   Modules accepted: Orders

## 2011-05-31 LAB — GLUCOSE TOLERANCE, 1 HOUR: Glucose, 1 Hour GTT: 122 mg/dL (ref 70–140)

## 2011-05-31 LAB — WET PREP, GENITAL: Trich, Wet Prep: NONE SEEN

## 2011-06-01 ENCOUNTER — Telehealth: Payer: Self-pay | Admitting: *Deleted

## 2011-06-01 LAB — CULTURE, OB URINE: Colony Count: >=100000

## 2011-06-01 MED ORDER — FLUCONAZOLE 150 MG PO TABS: 150.0000 mg | ORAL_TABLET | Freq: Once | ORAL | Status: AC

## 2011-06-01 MED ORDER — CEPHALEXIN 500 MG PO CAPS: 500.0000 mg | ORAL_CAPSULE | Freq: Two times a day (BID) | ORAL | Status: AC

## 2011-06-01 NOTE — Telephone Encounter (Signed)
Message copied by Jill Side on Fri Jun 01, 2011 11:49 AM ------      Message from: Levie Heritage      Created: Fri Jun 01, 2011 10:32 AM       Please call with positive urine culture results and + yeast infection.  Please call in keflex 500mg  bid x 7 days and diflucan 150mg  once.

## 2011-06-01 NOTE — Telephone Encounter (Signed)
Message left for pt that I am calling with test results and treatment information. Please call back 

## 2011-06-04 NOTE — Telephone Encounter (Signed)
Spoke with patient. Notified her of uti and yeast infection. Advised that she can pick up her meds at her pharmacy. Pt agrees with plan and will pick up her medications.

## 2011-06-13 ENCOUNTER — Other Ambulatory Visit: Payer: Self-pay

## 2011-06-13 ENCOUNTER — Ambulatory Visit (INDEPENDENT_AMBULATORY_CARE_PROVIDER_SITE_OTHER): Payer: Medicaid Other | Admitting: Physician Assistant

## 2011-06-13 DIAGNOSIS — Z3480 Encounter for supervision of other normal pregnancy, unspecified trimester: Secondary | ICD-10-CM

## 2011-06-13 DIAGNOSIS — O34219 Maternal care for unspecified type scar from previous cesarean delivery: Secondary | ICD-10-CM

## 2011-06-13 LAB — POCT URINALYSIS DIP (DEVICE)
Bilirubin Urine: NEGATIVE
Glucose, UA: NEGATIVE mg/dL
Ketones, ur: NEGATIVE mg/dL
Leukocytes, UA: NEGATIVE
Nitrite: NEGATIVE
Protein, ur: NEGATIVE mg/dL
Specific Gravity, Urine: 1.025 (ref 1.005–1.030)
Urobilinogen, UA: 1 mg/dL (ref 0.0–1.0)
pH: 6.5 (ref 5.0–8.0)

## 2011-06-13 NOTE — Progress Notes (Signed)
Edema: trace on hands Pain: pelvic radiating to lower extremities  Pressure: none

## 2011-06-13 NOTE — Progress Notes (Signed)
No complaints, taking ABX for UTI. Will start suppression valtrex next week. Will schedule repeat c-section, preferrs 07/30/10

## 2011-06-13 NOTE — Progress Notes (Signed)
LRTC and BTS scheduled

## 2011-06-28 ENCOUNTER — Telehealth: Payer: Self-pay | Admitting: *Deleted

## 2011-06-28 NOTE — Telephone Encounter (Signed)
Patient called and left a message on voicemail 06/27/11 at 1:18pm stating I am a patient there. I need to talk to a nurse. I've been in a lot of pain and pressure. Should I come in or make an appointment. RN Called patient today and left a message- if you have not already came to Maternity admissions please come to Maternity admissions  And/or call and speak with a nurse.

## 2011-07-02 ENCOUNTER — Telehealth: Payer: Self-pay | Admitting: *Deleted

## 2011-07-02 ENCOUNTER — Inpatient Hospital Stay (HOSPITAL_COMMUNITY)
Admission: AD | Admit: 2011-07-02 | Discharge: 2011-07-02 | Disposition: A | Discharge status: Home / Self Care | Payer: Medicaid Other | Source: Ambulatory Visit | Attending: Obstetrics & Gynecology | Admitting: Obstetrics & Gynecology

## 2011-07-02 ENCOUNTER — Encounter (HOSPITAL_COMMUNITY): Payer: Self-pay

## 2011-07-02 DIAGNOSIS — Z3490 Encounter for supervision of normal pregnancy, unspecified, unspecified trimester: Secondary | ICD-10-CM

## 2011-07-02 DIAGNOSIS — O98519 Other viral diseases complicating pregnancy, unspecified trimester: Secondary | ICD-10-CM | POA: Insufficient documentation

## 2011-07-02 DIAGNOSIS — O4700 False labor before 37 completed weeks of gestation, unspecified trimester: Secondary | ICD-10-CM | POA: Insufficient documentation

## 2011-07-02 DIAGNOSIS — A6000 Herpesviral infection of urogenital system, unspecified: Secondary | ICD-10-CM | POA: Insufficient documentation

## 2011-07-02 LAB — URINALYSIS, ROUTINE W REFLEX MICROSCOPIC
Glucose, UA: NEGATIVE mg/dL
Ketones, ur: 15 mg/dL — AB
Leukocytes, UA: NEGATIVE
Protein, ur: 30 mg/dL — AB

## 2011-07-02 LAB — URINE MICROSCOPIC-ADD ON

## 2011-07-02 MED ORDER — OXYCODONE-ACETAMINOPHEN 5-325 MG PO TABS: 1.0000 | ORAL_TABLET | Freq: Four times a day (QID) | ORAL | Status: DC | PRN

## 2011-07-02 MED ORDER — OXYCODONE-ACETAMINOPHEN 5-325 MG PO TABS: 1.0000 | ORAL_TABLET | Freq: Four times a day (QID) | ORAL | Status: AC | PRN

## 2011-07-02 NOTE — Telephone Encounter (Signed)
No answer. Pt is coming to clinic on Weds 07/04/11.

## 2011-07-02 NOTE — Progress Notes (Signed)
Patient is scheduled for a repeat cesarean section on 1-8.

## 2011-07-02 NOTE — Telephone Encounter (Signed)
Pt left message stating that she is having painful UC's and pressure and does not know what to do.  I returned pt's call and she stated that her UC's are not consistently close together but they are very strong. They range from 5-45 minutes apart. She denies vaginal bleeding. I recommended that she increase po fluids and lie down to rest for atleast 20-30 min. I advised that she come to hospital if she has UC's q5-6' apart for >1 hr, for fluid leaking from vagina, decreased fetal movement or for vaginal bleeding. Pt voiced understanding.

## 2011-07-02 NOTE — Progress Notes (Signed)
Patient is here with c/o progressing pressure, bilateral groin sharp pain that radiates to her lower back. She denies any vaginal bleeding, lof or discharge. She states that she feels baby at least 5x times an hour but feel that it is decreased. She c/o irregular contraction 1 or 2 in an hour.

## 2011-07-02 NOTE — Progress Notes (Signed)
Patient state she has been having contractions for about 4 days. Now are coming every 7-10 and having pain with urination. States the baby is moving less than usual. Denies any leaking or bleeding.

## 2011-07-02 NOTE — ED Provider Notes (Signed)
History     Chief Complaint  Patient presents with  . Contractions   HPI Haley Wiley 25 y.o. female  [id number] at [redacted]w[redacted]d presenting with abdominal pain.  Of note, patient scheduled for c-section on 07-30-2010.   Patient says that she has felt occasional contractions for the last week. Over last 4 days, she describes constant pain in a linear fashion along her lower abdomen/upper groin. She says it is a 7/10. When she has occasional contractions ranging from every 10 minutes to every 2 hours-the pain becomes almost 10/10. Has been taking tylenol with only modest relief.   Patient denies decreased fetal movement (at least 5x per hour)/abnormal discharge/blood from vagina/rush of fluid. Denies fever/chills. Has had some pain with urination.   OB History    Grav Para Term Preterm Abortions TAB SAB Ect Mult Living   4 2 2  0 1 0 1 0 0 2      Past Medical History  Diagnosis Date  . Herpes genitalia     last outbreak early 2012  . No pertinent past medical history     Past Surgical History  Procedure Date  . Cesarean section     2    Family History  Problem Relation Age of Onset  . Cancer Maternal Grandmother     lung  . Cancer Maternal Grandfather     lung  . Cancer Paternal Grandfather     lung    History  Substance Use Topics  . Smoking status: Current Some Day Smoker -- 0.2 packs/day for 4 years    Types: Cigarettes  . Smokeless tobacco: Never Used  . Alcohol Use: No    Allergies: No Known Allergies  Prescriptions prior to admission  Medication Sig Dispense Refill  . acetaminophen (TYLENOL) 325 MG tablet Take 650 mg by mouth every 6 (six) hours as needed. For pain       . cephALEXin (KEFLEX) 500 MG capsule Take 500 mg by mouth 4 (four) times daily.        . fluconazole (DIFLUCAN) 150 MG tablet Take 150 mg by mouth once.        . prenatal vitamin w/FE, FA (PRENATAL 1 + 1) 27-1 MG TABS Take 1 tablet by mouth daily.          ROS negative except as noted in HPI    Physical Exam   Blood pressure 121/71, pulse 77, temperature 99.3 F (37.4 C), temperature source Oral, resp. rate 20, height 5\' 4"  (1.626 m), weight 60.147 kg (132 lb 9.6 oz), last menstrual period 10/12/2010, SpO2 99.00%.  Physical Exam  Vitals reviewed. Constitutional: She is oriented to person, place, and time. She appears well-nourished. No distress.  Cardiovascular: Normal rate and regular rhythm.  Exam reveals no gallop and no friction rub.   No murmur heard. Respiratory: Breath sounds normal. No respiratory distress. She has no wheezes. She has no rales.  GI:       Gravid. Size consistent with dates. Patient does not appear uncomfortable with palpation of abdomen.   Genitourinary: Vagina normal.  Musculoskeletal: She exhibits no edema.  Neurological: She is alert and oriented to person, place, and time.   Dilation: 1 Effacement (%): 50 Cervical Position: Posterior Station: -2 Presentation: Vertex Exam by:: Peace, rn and dr Therapist, nutritional No CVA tenderness.   FHT-occasional contraction. 145 baseline. Reactive. Accels present. Decels absent.   [facility] Course  Procedures  MDM -obtain UA Will obtain culture due to many bacteria (no leukocytes, 0-2  wbc, neg nitrite) No CVA tenderness or recent fevers-low likihood pyelo  Assessment and Plan  #1 [id number] at [redacted]w[redacted]d #2 Herpes Genitalis #3 Reassuring FHT #4 MSK pain  Discharge home. 7 percocets. Follow up in clinic on Wednesday. Encouraged pregnancy band for pain. Encourage hydration.  Case discussed with Caren Griffins, CNM and sent for cosign.   HUNTER, STEPHEN 07/02/2011, 7:16 PM  Saw pt and agree Oval Cavazos

## 2011-07-04 ENCOUNTER — Telehealth: Payer: Self-pay | Admitting: *Deleted

## 2011-07-04 ENCOUNTER — Ambulatory Visit (INDEPENDENT_AMBULATORY_CARE_PROVIDER_SITE_OTHER): Payer: Medicaid Other | Admitting: Obstetrics and Gynecology

## 2011-07-04 DIAGNOSIS — Z3480 Encounter for supervision of other normal pregnancy, unspecified trimester: Secondary | ICD-10-CM

## 2011-07-04 DIAGNOSIS — O34219 Maternal care for unspecified type scar from previous cesarean delivery: Secondary | ICD-10-CM

## 2011-07-04 DIAGNOSIS — Z3490 Encounter for supervision of normal pregnancy, unspecified, unspecified trimester: Secondary | ICD-10-CM

## 2011-07-04 DIAGNOSIS — A6000 Herpesviral infection of urogenital system, unspecified: Secondary | ICD-10-CM

## 2011-07-04 DIAGNOSIS — N898 Other specified noninflammatory disorders of vagina: Secondary | ICD-10-CM

## 2011-07-04 DIAGNOSIS — N879 Dysplasia of cervix uteri, unspecified: Secondary | ICD-10-CM

## 2011-07-04 LAB — POCT URINALYSIS DIP (DEVICE)
Glucose, UA: NEGATIVE mg/dL
Leukocytes, UA: NEGATIVE
Nitrite: NEGATIVE
Urobilinogen, UA: 1 mg/dL (ref 0.0–1.0)

## 2011-07-04 NOTE — Progress Notes (Signed)
Addended by: Lynnell Dike on: 07/04/2011 01:27 PM   Modules accepted: Orders

## 2011-07-04 NOTE — Progress Notes (Signed)
Edema traces on hands, feet. Pain on lower back radiating to pelvic and down to BLE. C/O vaginal pressure. Vaginal d/c as mucous white with "fleshy" smell.

## 2011-07-04 NOTE — Telephone Encounter (Signed)
Pt left message stating that she would like a refill of oxycodone which was prescribed on 12/10 ! [facility]. I consulted with Dr. Jolayne Panther who saw the patient earlier today for colpo.  Oxycodone Rx denied and recommendation made that if pt is having significant pain she should go to [facility] for evaluation of labor. I called pt and informed her that she cannot get a refill @ this time. I asked if her pain was much worse than when she was @ [facility] 2 days ago. She said it is the same and she has sporadic UC's with back pain. She reports that she has 1.5 pills left of the original #7 that were prescribed. I advised that she use tylenol by itself for now and save the remaining oxycodone for episodes of increased discomfort. I discussed that we cannot continue long-term narcotics @ this gestation. I also advised that if her pain becomes worse and she thinks she may be in labor, she should go to [facility] for evaluation. Pt voiced understanding of all information and instructions.

## 2011-07-04 NOTE — Patient Instructions (Signed)
Breastfeeding BENEFITS OF BREASTFEEDING For the baby  The first milk (colostrum) helps the baby's digestive system function better.   There are antibodies from the mother in the milk that help the baby fight off infections.   The baby has a lower incidence of asthma, allergies, and SIDS (sudden infant death syndrome).   The nutrients in breast milk are better than formulas for the baby and helps the baby's brain grow better.   Babies who breastfeed have less gas, colic, and constipation.  For the mother  Breastfeeding helps develop a very special bond between mother and baby.   It is more convenient, always available at the correct temperature and cheaper than formula feeding.   It burns calories in the mother and helps with losing weight that was gained during pregnancy.   It makes the uterus contract back down to normal size faster and slows bleeding following delivery.   Breastfeeding mothers have a lower risk of developing breast cancer.  NURSE FREQUENTLY  A healthy, full-term baby may breastfeed as often as every hour or space his or her feedings to every 3 hours.   How often to nurse will vary from baby to baby. Watch your baby for signs of hunger, not the clock.   Nurse as often as the baby requests, or when you feel the need to reduce the fullness of your breasts.   Awaken the baby if it has been 3 to 4 hours since the last feeding.   Frequent feeding will help the mother make more milk and will prevent problems like sore nipples and engorgement of the breasts.  BABY'S POSITION AT THE BREAST  Whether lying down or sitting, be sure that the baby's tummy is facing your tummy.   Support the breast with 4 fingers underneath the breast and the thumb above. Make sure your fingers are well away from the nipple and baby's mouth.   Stroke the baby's lips and cheek closest to the breast gently with your finger or nipple.   When the baby's mouth is open wide enough, place  all of your nipple and as much of the dark area around the nipple as possible into your baby's mouth.   Pull the baby in close so the tip of the nose and the baby's cheeks touch the breast during the feeding.  FEEDINGS  The length of each feeding varies from baby to baby and from feeding to feeding.   The baby must suck about 2 to 3 minutes for your milk to get to him or her. This is called a "let down." For this reason, allow the baby to feed on each breast as long as he or she wants. Your baby will end the feeding when he or she has received the right balance of nutrients.   To break the suction, put your finger into the corner of the baby's mouth and slide it between his or her gums before removing your breast from his or her mouth. This will help prevent sore nipples.  REDUCING BREAST ENGORGEMENT  In the first week after your baby is born, you may experience signs of breast engorgement. When breasts are engorged, they feel heavy, warm, full, and may be tender to the touch. You can reduce engorgement if you:   Nurse frequently, every 2 to 3 hours. Mothers who breastfeed early and often have fewer problems with engorgement.   Place light ice packs on your breasts between feedings. This reduces swelling. Wrap the ice packs in a   lightweight towel to protect your skin.   Apply moist hot packs to your breast for 5 to 10 minutes before each feeding. This increases circulation and helps the milk flow.   Gently massage your breast before and during the feeding.   Make sure that the baby empties at least one breast at every feeding before switching sides.   Use a breast pump to empty the breasts if your baby is sleepy or not nursing well. You may also want to pump if you are returning to work or or you feel you are getting engorged.   Avoid bottle feeds, pacifiers or supplemental feedings of water or juice in place of breastfeeding.   Be sure the baby is latched on and positioned properly while  breastfeeding.   Prevent fatigue, stress, and anemia.   Wear a supportive bra, avoiding underwire styles.   Eat a balanced diet with enough fluids.  If you follow these suggestions, your engorgement should improve in 24 to 48 hours. If you are still experiencing difficulty, call your lactation consultant or caregiver. IS MY BABY GETTING ENOUGH MILK? Sometimes, mothers worry about whether their babies are getting enough milk. You can be assured that your baby is getting enough milk if:  The baby is actively sucking and you hear swallowing.   The baby nurses at least 8 to 12 times in a 24 hour time period. Nurse your baby until he or she unlatches or falls asleep at the first breast (at least 10 to 20 minutes), then offer the second side.   The baby is wetting 5 to 6 disposable diapers (6 to 8 cloth diapers) in a 24 hour period by 5 to 6 days of age.   The baby is having at least 2 to 3 stools every 24 hours for the first few months. Breast milk is all the food your baby needs. It is not necessary for your baby to have water or formula. In fact, to help your breasts make more milk, it is best not to give your baby supplemental feedings during the early weeks.   The stool should be soft and yellow.   The baby should gain 4 to 7 ounces per week after he is 4 days old.  TAKE CARE OF YOURSELF Take care of your breasts by:  Bathing or showering daily.   Avoiding the use of soaps on your nipples.   Start feedings on your left breast at one feeding and on your right breast at the next feeding.   You will notice an increase in your milk supply 2 to 5 days after delivery. You may feel some discomfort from engorgement, which makes your breasts very firm and often tender. Engorgement "peaks" out within 24 to 48 hours. In the meantime, apply warm moist towels to your breasts for 5 to 10 minutes before feeding. Gentle massage and expression of some milk before feeding will soften your breasts, making  it easier for your baby to latch on. Wear a well fitting nursing bra and air dry your nipples for 10 to 15 minutes after each feeding.   Only use cotton bra pads.   Only use pure lanolin on your nipples after nursing. You do not need to wash it off before nursing.  Take care of yourself by:   Eating well-balanced meals and nutritious snacks.   Drinking milk, fruit juice, and water to satisfy your thirst (about 8 glasses a day).   Getting plenty of rest.   Increasing calcium in   your diet (1200 mg a day).   Avoiding foods that you notice affect the baby in a bad way.  SEEK MEDICAL CARE IF:   You have any questions or difficulty with breastfeeding.   You need help.   You have a hard, red, sore area on your breast, accompanied by a fever of 100.5 F (38.1 C) or more.   Your baby is too sleepy to eat well or is having trouble sleeping.   Your baby is wetting less than 6 diapers per day, by 5 days of age.   Your baby's skin or white part of his or her eyes is more yellow than it was in the hospital.   You feel depressed.  Document Released: 07/09/2005 Document Revised: 03/21/2011 Document Reviewed: 02/21/2009 [employer] Patient Information 2012 [employer]. 

## 2011-07-04 NOTE — Progress Notes (Signed)
Patient c/o excessive discharge with foul odor. Wet prep collected. Patient with abnormal pap smear in nov (ASCUS +HPV) and also here for colpo today. No abnormalities seen on colpo. TZ visualized. Plan to repeat pap smear 6 weeks postpartum. All questions answered. FM/PTL precautions reviewed. Cx: 1-2/50/-3 Cultures at next visit

## 2011-07-05 ENCOUNTER — Encounter: Payer: Self-pay | Admitting: *Deleted

## 2011-07-05 DIAGNOSIS — O34219 Maternal care for unspecified type scar from previous cesarean delivery: Secondary | ICD-10-CM | POA: Insufficient documentation

## 2011-07-05 LAB — WET PREP, GENITAL
Trich, Wet Prep: NONE SEEN
Yeast Wet Prep HPF POC: NONE SEEN

## 2011-07-06 ENCOUNTER — Telehealth: Payer: Self-pay | Admitting: *Deleted

## 2011-07-06 DIAGNOSIS — N879 Dysplasia of cervix uteri, unspecified: Secondary | ICD-10-CM

## 2011-07-06 MED ORDER — METRONIDAZOLE 500 MG PO TABS: 500.0000 mg | ORAL_TABLET | Freq: Two times a day (BID) | ORAL | Status: DC

## 2011-07-06 NOTE — Telephone Encounter (Signed)
Called pt and notified her of BV and she will go get abx at pharmacy.

## 2011-07-06 NOTE — Telephone Encounter (Signed)
Message copied by Mannie Stabile on Fri Jul 06, 2011 11:15 AM ------      Message from: Catalina Antigua      Created: Fri Jul 06, 2011 10:57 AM       Please inform patient of positive BV and that prescription for flagyl has been e-prescribed            Gigi Gin

## 2011-07-06 NOTE — Progress Notes (Signed)
Addended by: Catalina Antigua on: 07/06/2011 10:57 AM   Modules accepted: Orders

## 2011-07-11 ENCOUNTER — Ambulatory Visit (INDEPENDENT_AMBULATORY_CARE_PROVIDER_SITE_OTHER): Payer: Medicaid Other | Admitting: Obstetrics and Gynecology

## 2011-07-11 ENCOUNTER — Encounter: Payer: Self-pay | Admitting: *Deleted

## 2011-07-11 ENCOUNTER — Encounter: Payer: Self-pay | Admitting: Obstetrics and Gynecology

## 2011-07-11 ENCOUNTER — Other Ambulatory Visit: Payer: Self-pay | Admitting: Obstetrics and Gynecology

## 2011-07-11 VITALS — BP 103/69 | Temp 97.5°F | Wt 130.2 lb

## 2011-07-11 DIAGNOSIS — O99330 Smoking (tobacco) complicating pregnancy, unspecified trimester: Secondary | ICD-10-CM

## 2011-07-11 DIAGNOSIS — O34219 Maternal care for unspecified type scar from previous cesarean delivery: Secondary | ICD-10-CM

## 2011-07-11 DIAGNOSIS — F17200 Nicotine dependence, unspecified, uncomplicated: Secondary | ICD-10-CM

## 2011-07-11 HISTORY — DX: Smoking (tobacco) complicating pregnancy, unspecified trimester: O99.330

## 2011-07-11 LAB — POCT URINALYSIS DIP (DEVICE)
Leukocytes, UA: NEGATIVE
Nitrite: NEGATIVE
Protein, ur: NEGATIVE mg/dL
pH: 7 (ref 5.0–8.0)

## 2011-07-11 MED ORDER — VALACYCLOVIR HCL 500 MG PO TABS: 500.0000 mg | ORAL_TABLET | Freq: Every day | ORAL | Status: DC

## 2011-07-11 NOTE — Progress Notes (Signed)
UC discomfort and passed mucus plug. Sched tertiary C/S/BTL 07/30/10. Smokes 2-3 cig/d. Plans breastfeed. Last genital HSV 1 yr ago. Will Rx Valtrex. GC/CT, GBS sent.

## 2011-07-11 NOTE — Progress Notes (Signed)
Swelling in hands and feet. Pt states having a lot of pelvic pressure and pt complains of frequent contractions.  Vaginal discharge was yellow mucus color when wiping.

## 2011-07-11 NOTE — Patient Instructions (Signed)
Pregnancy - Third Trimester The third trimester of pregnancy (the last 3 months) is a period of the most rapid growth for you and your baby. The baby approaches a length of 20 inches and a weight of 6 to 10 pounds. The baby is adding on fat and getting ready for life outside your body. While inside, babies have periods of sleeping and waking, suck their thumbs, and hiccups. You can often feel small contractions of the uterus. This is false labor. It is also called Braxton-Hicks contractions. This is like a practice for labor. The usual problems in this stage of pregnancy include more difficulty breathing, swelling of the hands and feet from water retention, and having to urinate more often because of the uterus and baby pressing on your bladder.  PRENATAL EXAMS  Blood work may continue to be done during prenatal exams. These tests are done to check on your health and the probable health of your baby. Blood work is used to follow your blood levels (hemoglobin). Anemia (low hemoglobin) is common during pregnancy. Iron and vitamins are given to help prevent this. You may also continue to be checked for diabetes. Some of the past blood tests may be done again.   The size of the uterus is measured during each visit. This makes sure your baby is growing properly according to your pregnancy dates.   Your blood pressure is checked every prenatal visit. This is to make sure you are not getting toxemia.   Your urine is checked every prenatal visit for infection, diabetes and protein.   Your weight is checked at each visit. This is done to make sure gains are happening at the suggested rate and that you and your baby are growing normally.   Sometimes, an ultrasound is performed to confirm the position and the proper growth and development of the baby. This is a test done that bounces harmless sound waves off the baby so your caregiver can more accurately determine due dates.   Discuss the type of pain  medication and anesthesia you will have during your labor and delivery.   Discuss the possibility and anesthesia if a Cesarean Section might be necessary.   Inform your caregiver if there is any mental or physical violence at home.  Sometimes, a specialized non-stress test, contraction stress test and biophysical profile are done to make sure the baby is not having a problem. Checking the amniotic fluid surrounding the baby is called an amniocentesis. The amniotic fluid is removed by sticking a needle into the belly (abdomen). This is sometimes done near the end of pregnancy if an early delivery is required. In this case, it is done to help make sure the baby's lungs are mature enough for the baby to live outside of the womb. If the lungs are not mature and it is unsafe to deliver the baby, an injection of cortisone medication is given to the mother 1 to 2 days before the delivery. This helps the baby's lungs mature and makes it safer to deliver the baby. CHANGES OCCURING IN THE THIRD TRIMESTER OF PREGNANCY Your body goes through many changes during pregnancy. They vary from person to person. Talk to your caregiver about changes you notice and are concerned about.  During the last trimester, you have probably had an increase in your appetite. It is normal to have cravings for certain foods. This varies from person to person and pregnancy to pregnancy.   You may begin to get stretch marks on your hips,   abdomen, and breasts. These are normal changes in the body during pregnancy. There are no exercises or medications to take which prevent this change.   Constipation may be treated with a stool softener or adding bulk to your diet. Drinking lots of fluids, fiber in vegetables, fruits, and whole grains are helpful.   Exercising is also helpful. If you have been very active up until your pregnancy, most of these activities can be continued during your pregnancy. If you have been less active, it is helpful  to start an exercise program such as walking. Consult your caregiver before starting exercise programs.   Avoid all smoking, alcohol, un-prescribed drugs, herbs and "street drugs" during your pregnancy. These chemicals affect the formation and growth of the baby. Avoid chemicals throughout the pregnancy to ensure the delivery of a healthy infant.   Backache, varicose veins and hemorrhoids may develop or get worse.   You will tire more easily in the third trimester, which is normal.   The baby's movements may be stronger and more often.   You may become short of breath easily.   Your belly button may stick out.   A yellow discharge may leak from your breasts called colostrum.   You may have a bloody mucus discharge. This usually occurs a few days to a week before labor begins.  HOME CARE INSTRUCTIONS   Keep your caregiver's appointments. Follow your caregiver's instructions regarding medication use, exercise, and diet.   During pregnancy, you are providing food for you and your baby. Continue to eat regular, well-balanced meals. Choose foods such as meat, fish, milk and other low fat dairy products, vegetables, fruits, and whole-grain breads and cereals. Your caregiver will tell you of the ideal weight gain.   A physical sexual relationship may be continued throughout pregnancy if there are no other problems such as early (premature) leaking of amniotic fluid from the membranes, vaginal bleeding, or belly (abdominal) pain.   Exercise regularly if there are no restrictions. Check with your caregiver if you are unsure of the safety of your exercises. Greater weight gain will occur in the last 2 trimesters of pregnancy. Exercising helps:   Control your weight.   Get you in shape for labor and delivery.   You lose weight after you deliver.   Rest a lot with legs elevated, or as needed for leg cramps or low back pain.   Wear a good support or jogging bra for breast tenderness during  pregnancy. This may help if worn during sleep. Pads or tissues may be used in the bra if you are leaking colostrum.   Do not use hot tubs, steam rooms, or saunas.   Wear your seat belt when driving. This protects you and your baby if you are in an accident.   Avoid raw meat, cat litter boxes and soil used by cats. These carry germs that can cause birth defects in the baby.   It is easier to loose urine during pregnancy. Tightening up and strengthening the pelvic muscles will help with this problem. You can practice stopping your urination while you are going to the bathroom. These are the same muscles you need to strengthen. It is also the muscles you would use if you were trying to stop from passing gas. You can practice tightening these muscles up 10 times a set and repeating this about 3 times per day. Once you know what muscles to tighten up, do not perform these exercises during urination. It is more likely   to cause an infection by backing up the urine.   Ask for help if you have financial, counseling or nutritional needs during pregnancy. Your caregiver will be able to offer counseling for these needs as well as refer you for other special needs.   Make a list of emergency phone numbers and have them available.   Plan on getting help from family or friends when you go home from the hospital.   Make a trial run to the hospital.   Take prenatal classes with the father to understand, practice and ask questions about the labor and delivery.   Prepare the baby's room/nursery.   Do not travel out of the city unless it is absolutely necessary and with the advice of your caregiver.   Wear only low or no heal shoes to have better balance and prevent falling.  MEDICATIONS AND DRUG USE IN PREGNANCY  Take prenatal vitamins as directed. The vitamin should contain 1 milligram of folic acid. Keep all vitamins out of reach of children. Only a couple vitamins or tablets containing iron may be fatal  to a baby or young child when ingested.   Avoid use of all medications, including herbs, over-the-counter medications, not prescribed or suggested by your caregiver. Only take over-the-counter or prescription medicines for pain, discomfort, or fever as directed by your caregiver. Do not use aspirin, ibuprofen (Motrin, Advil, Nuprin) or naproxen (Aleve) unless OK'd by your caregiver.   Let your caregiver also know about herbs you may be using.   Alcohol is related to a number of birth defects. This includes fetal alcohol syndrome. All alcohol, in any form, should be avoided completely. Smoking will cause low birth rate and premature babies.   Street/illegal drugs are very harmful to the baby. They are absolutely forbidden. A baby born to an addicted mother will be addicted at birth. The baby will go through the same withdrawal an adult does.  SEEK MEDICAL CARE IF: You have any concerns or worries during your pregnancy. It is better to call with your questions if you feel they cannot wait, rather than worry about them. DECISIONS ABOUT CIRCUMCISION You may or may not know the sex of your baby. If you know your baby is a boy, it may be time to think about circumcision. Circumcision is the removal of the foreskin of the penis. This is the skin that covers the sensitive end of the penis. There is no proven medical need for this. Often this decision is made on what is popular at the time or based upon religious beliefs and social issues. You can discuss these issues with your caregiver or pediatrician. SEEK IMMEDIATE MEDICAL CARE IF:   An unexplained oral temperature above 102 F (38.9 C) develops, or as your caregiver suggests.   You have leaking of fluid from the vagina (birth canal). If leaking membranes are suspected, take your temperature and tell your caregiver of this when you call.   There is vaginal spotting, bleeding or passing clots. Tell your caregiver of the amount and how many pads are  used.   You develop a bad smelling vaginal discharge with a change in the color from clear to white.   You develop vomiting that lasts more than 24 hours.   You develop chills or fever.   You develop shortness of breath.   You develop burning on urination.   You loose more than 2 pounds of weight or gain more than 2 pounds of weight or as suggested by your   caregiver.   You notice sudden swelling of your face, hands, and feet or legs.   You develop belly (abdominal) pain. Round ligament discomfort is a common non-cancerous (benign) cause of abdominal pain in pregnancy. Your caregiver still must evaluate you.   You develop a severe headache that does not go away.   You develop visual problems, blurred or double vision.   If you have not felt your baby move for more than 1 hour. If you think the baby is not moving as much as usual, eat something with sugar in it and lie down on your left side for an hour. The baby should move at least 4 to 5 times per hour. Call right away if your baby moves less than that.   You fall, are in a car accident or any kind of trauma.   There is mental or physical violence at home.  Document Released: 07/03/2001 Document Revised: 03/21/2011 Document Reviewed: 01/05/2009 [facility] Patient Information 2012 [place], [place].Smoking Cessation This document explains the best ways for you to quit smoking and new treatments to help. It lists new medicines that can double or triple your chances of quitting and quitting for good. It also considers ways to avoid relapses and concerns you may have about quitting, including weight gain. NICOTINE: A POWERFUL ADDICTION If you have tried to quit smoking, you know how hard it can be. It is hard because nicotine is a very addictive drug. For some people, it can be as addictive as heroin or cocaine. Usually, people make 2 or 3 tries, or more, before finally being able to quit. Each time you try to quit, you can learn about what  helps and what hurts. Quitting takes hard work and a lot of effort, but you can quit smoking. QUITTING SMOKING IS ONE OF THE MOST IMPORTANT THINGS YOU WILL EVER DO.  You will live longer, feel better, and live better.   The impact on your body of quitting smoking is felt almost immediately:   Within 20 minutes, blood pressure decreases. Pulse returns to its normal level.   After 8 hours, carbon monoxide levels in the blood return to normal. Oxygen level increases.   After 24 hours, chance of heart attack starts to decrease. Breath, hair, and body stop smelling like smoke.   After 48 hours, damaged nerve endings begin to recover. Sense of taste and smell improve.   After 72 hours, the body is virtually free of nicotine. Bronchial tubes relax and breathing becomes easier.   After 2 to 12 weeks, lungs can hold more air. Exercise becomes easier and circulation improves.   Quitting will reduce your risk of having a heart attack, stroke, cancer, or lung disease:   After 1 year, the risk of coronary heart disease is cut in half.   After 5 years, the risk of stroke falls to the same as a nonsmoker.   After 10 years, the risk of lung cancer is cut in half and the risk of other cancers decreases significantly.   After 15 years, the risk of coronary heart disease drops, usually to the level of a nonsmoker.   If you are pregnant, quitting smoking will improve your chances of having a healthy baby.   The people you live with, especially your children, will be healthier.   You will have extra money to spend on things other than cigarettes.  FIVE KEYS TO QUITTING Studies have shown that these 5 steps will help you quit smoking and quit for  good. You have the best chances of quitting if you use them together: 1. Get ready.  2. Get support and encouragement.  3. Learn new skills and behaviors.  4. Get medicine to reduce your nicotine addiction and use it correctly.  5. Be prepared for relapse  or difficult situations. Be determined to continue trying to quit, even if you do not succeed at first.  1. GET READY  Set a quit date.   Change your environment.   Get rid of ALL cigarettes, ashtrays, matches, and lighters in your home, car, and place of work.   Do not let people smoke in your home.   Review your past attempts to quit. Think about what worked and what did not.   Once you quit, do not smoke. NOT EVEN A PUFF!  2. GET SUPPORT AND ENCOURAGEMENT Studies have shown that you have a better chance of being successful if you have help. You can get support in many ways.  Tell your family, friends, and coworkers that you are going to quit and need their support. Ask them not to smoke around you.   Talk to your caregivers (doctor, dentist, nurse, pharmacist, psychologist, and/or smoking counselor).   Get individual, group, or telephone counseling and support. The more counseling you have, the better your chances are of quitting. Programs are available at [employer] and health centers. Call your local health department for information about programs in your area.   Spiritual beliefs and practices may help some smokers quit.   Quit meters are Photographer that keep track of quit statistics, such as amount of "quit-time," cigarettes not smoked, and money saved.   Many smokers find one or more of the many self-help books available useful in helping them quit and stay off tobacco.  3. LEARN NEW SKILLS AND BEHAVIORS  Try to distract yourself from urges to smoke. Talk to someone, go for a walk, or occupy your time with a task.   When you first try to quit, change your routine. Take a different route to work. Drink tea instead of coffee. Eat breakfast in a different place.   Do something to reduce your stress. Take a hot bath, exercise, or read a book.   Plan something enjoyable to do every day. Reward yourself for not smoking.   Explore  interactive web-based programs that specialize in helping you quit.  4. GET MEDICINE AND USE IT CORRECTLY Medicines can help you stop smoking and decrease the urge to smoke. Combining medicine with the above behavioral methods and support can quadruple your chances of successfully quitting smoking. The [place] Food and Drug Administration (FDA) has approved 7 medicines to help you quit smoking. These medicines fall into 3 categories.  Nicotine replacement therapy (delivers nicotine to your body without the negative effects and risks of smoking):   Nicotine gum: Available over-the-counter.   Nicotine lozenges: Available over-the-counter.   Nicotine inhaler: Available by prescription.   Nicotine nasal spray: Available by prescription.   Nicotine skin patches (transdermal): Available by prescription and over-the-counter.   Antidepressant medicine (helps people abstain from smoking, but how this works is unknown):   Bupropion sustained-release (SR) tablets: Available by prescription.   Nicotinic receptor partial agonist (simulates the effect of nicotine in your brain):   Varenicline tartrate tablets: Available by prescription.   Ask your caregiver for advice about which medicines to use and how to use them. Carefully read the information on the package.   Everyone who  is trying to quit may benefit from using a medicine. If you are pregnant or trying to become pregnant, nursing an infant, you are under age 93, or you smoke fewer than 10 cigarettes per day, talk to your caregiver before taking any nicotine replacement medicines.   You should stop using a nicotine replacement product and call your caregiver if you experience nausea, dizziness, weakness, vomiting, fast or irregular heartbeat, mouth problems with the lozenge or gum, or redness or swelling of the skin around the patch that does not go away.   Do not use any other product containing nicotine while using a nicotine replacement  product.   Talk to your caregiver before using these products if you have diabetes, heart disease, asthma, stomach ulcers, you had a recent heart attack, you have high blood pressure that is not controlled with medicine, a history of irregular heartbeat, or you have been prescribed medicine to help you quit smoking.  5. BE PREPARED FOR RELAPSE OR DIFFICULT SITUATIONS  Most relapses occur within the first 3 months after quitting. Do not be discouraged if you start smoking again. Remember, most people try several times before they finally quit.   You may have symptoms of withdrawal because your body is used to nicotine. You may crave cigarettes, be irritable, feel very hungry, cough often, get headaches, or have difficulty concentrating.   The withdrawal symptoms are only temporary. They are strongest when you first quit, but they will go away within 10 to 14 days.  Here are some difficult situations to watch for:  Alcohol. Avoid drinking alcohol. Drinking lowers your chances of successfully quitting.   Caffeine. Try to reduce the amount of caffeine you consume. It also lowers your chances of successfully quitting.   Other smokers. Being around smoking can make you want to smoke. Avoid smokers.   Weight gain. Many smokers will gain weight when they quit, usually less than 10 pounds. Eat a healthy diet and stay active. Do not let weight gain distract you from your main goal, quitting smoking. Some medicines that help you quit smoking may also help delay weight gain. You can always lose the weight gained after you quit.   Bad mood or depression. There are a lot of ways to improve your mood other than smoking.  If you are having problems with any of these situations, talk to your caregiver. SPECIAL SITUATIONS AND CONDITIONS Studies suggest that everyone can quit smoking. Your situation or condition can give you a special reason to quit.  Pregnant women/new mothers: By quitting, you protect your  baby's health and your own.   Hospitalized patients: By quitting, you reduce health problems and help healing.   Heart attack patients: By quitting, you reduce your risk of a second heart attack.   Lung, head, and neck cancer patients: By quitting, you reduce your chance of a second cancer.   Parents of children and adolescents: By quitting, you protect your children from illnesses caused by secondhand smoke.  QUESTIONS TO THINK ABOUT Think about the following questions before you try to stop smoking. You may want to talk about your answers with your caregiver.  Why do you want to quit?   If you tried to quit in the past, what helped and what did not?   What will be the most difficult situations for you after you quit? How will you plan to handle them?   Who can help you through the tough times? Your family? Friends? Caregiver?   What  pleasures do you get from smoking? What ways can you still get pleasure if you quit?  Here are some questions to ask your caregiver:  How can you help me to be successful at quitting?   What medicine do you think would be best for me and how should I take it?   What should I do if I need more help?   What is smoking withdrawal like? How can I get information on withdrawal?  Quitting takes hard work and a lot of effort, but you can quit smoking. FOR MORE INFORMATION  Smokefree.gov (http://www.davis-sullivan.com/) provides free, accurate, evidence-based information and professional assistance to help support the immediate and long-term needs of people trying to quit smoking. Document Released: 07/03/2001 Document Revised: 03/21/2011 Document Reviewed: 04/25/2009 [facility] Patient Information 2012 [place], [place].

## 2011-07-13 ENCOUNTER — Encounter (HOSPITAL_COMMUNITY): Payer: Self-pay | Admitting: Pharmacist

## 2011-07-16 ENCOUNTER — Inpatient Hospital Stay (HOSPITAL_COMMUNITY)
Admission: AD | Admit: 2011-07-16 | Discharge: 2011-07-16 | Disposition: A | Discharge status: Home / Self Care | Payer: Medicaid Other | Source: Ambulatory Visit | Attending: Obstetrics and Gynecology | Admitting: Obstetrics and Gynecology

## 2011-07-16 ENCOUNTER — Encounter (HOSPITAL_COMMUNITY): Payer: Self-pay | Admitting: *Deleted

## 2011-07-16 DIAGNOSIS — O34219 Maternal care for unspecified type scar from previous cesarean delivery: Secondary | ICD-10-CM

## 2011-07-16 DIAGNOSIS — O479 False labor, unspecified: Secondary | ICD-10-CM

## 2011-07-16 MED ORDER — VALACYCLOVIR HCL 500 MG PO TABS: 500.0000 mg | ORAL_TABLET | Freq: Every day | ORAL | Status: DC

## 2011-07-16 MED ORDER — ZOLPIDEM TARTRATE 10 MG PO TABS: 10.0000 mg | ORAL_TABLET | Freq: Every evening | ORAL | Status: DC | PRN

## 2011-07-16 NOTE — Progress Notes (Signed)
Contractions started last night, getting worse.  Can't get comfortable.was 2 cm last wk.

## 2011-07-16 NOTE — Discharge Instructions (Signed)

## 2011-07-18 ENCOUNTER — Ambulatory Visit (INDEPENDENT_AMBULATORY_CARE_PROVIDER_SITE_OTHER): Payer: Medicaid Other | Admitting: Obstetrics and Gynecology

## 2011-07-18 DIAGNOSIS — O34219 Maternal care for unspecified type scar from previous cesarean delivery: Secondary | ICD-10-CM

## 2011-07-18 DIAGNOSIS — Z3493 Encounter for supervision of normal pregnancy, unspecified, third trimester: Secondary | ICD-10-CM

## 2011-07-18 DIAGNOSIS — O99330 Smoking (tobacco) complicating pregnancy, unspecified trimester: Secondary | ICD-10-CM

## 2011-07-18 DIAGNOSIS — Z3490 Encounter for supervision of normal pregnancy, unspecified, unspecified trimester: Secondary | ICD-10-CM

## 2011-07-18 DIAGNOSIS — Z3480 Encounter for supervision of other normal pregnancy, unspecified trimester: Secondary | ICD-10-CM

## 2011-07-18 LAB — POCT URINALYSIS DIP (DEVICE)
Ketones, ur: NEGATIVE mg/dL
Protein, ur: NEGATIVE mg/dL
Specific Gravity, Urine: 1.025 (ref 1.005–1.030)
Urobilinogen, UA: 1 mg/dL (ref 0.0–1.0)

## 2011-07-18 NOTE — Progress Notes (Signed)
Edema trace on hands and feet. Pain on lower back radiating across abdomen then to BLE with vaginal pressure. States yesterday had a clear mucous vaginal d/c with a streak of blood.

## 2011-07-18 NOTE — Progress Notes (Addendum)
[place] for ob f/u on 07/20/11 @ 1445

## 2011-07-18 NOTE — Progress Notes (Addendum)
Lots of RLP, UCs, LBP. No dysuria. Cult sent d/t tr LE and tr blood. Took Keflex last month for ASB. Advised to start Valtrex prophylaxis. S<D --> [place] growth, AFI. Cx post tight 2/50/-2 vtx

## 2011-07-20 ENCOUNTER — Encounter (HOSPITAL_COMMUNITY)
Admission: AD | Disposition: A | Discharge status: Home / Self Care | Payer: Self-pay | Source: Ambulatory Visit | Attending: Obstetrics & Gynecology

## 2011-07-20 ENCOUNTER — Encounter (HOSPITAL_COMMUNITY): Payer: Self-pay | Admitting: Anesthesiology

## 2011-07-20 ENCOUNTER — Inpatient Hospital Stay (HOSPITAL_COMMUNITY): Payer: Medicaid Other | Admitting: Anesthesiology

## 2011-07-20 ENCOUNTER — Inpatient Hospital Stay (HOSPITAL_COMMUNITY)
Admission: AD | Admit: 2011-07-20 | Discharge: 2011-07-23 | DRG: 766 | Disposition: A | Discharge status: Home / Self Care | Payer: Medicaid Other | Source: Ambulatory Visit | Attending: Obstetrics & Gynecology | Admitting: Obstetrics & Gynecology

## 2011-07-20 ENCOUNTER — Encounter (HOSPITAL_COMMUNITY): Payer: Self-pay | Admitting: *Deleted

## 2011-07-20 ENCOUNTER — Encounter (HOSPITAL_COMMUNITY): Payer: Self-pay

## 2011-07-20 ENCOUNTER — Ambulatory Visit (HOSPITAL_COMMUNITY): Admission: RE | Admit: 2011-07-20 | Payer: Medicaid Other | Source: Ambulatory Visit

## 2011-07-20 DIAGNOSIS — O34219 Maternal care for unspecified type scar from previous cesarean delivery: Secondary | ICD-10-CM

## 2011-07-20 DIAGNOSIS — Z302 Encounter for sterilization: Secondary | ICD-10-CM

## 2011-07-20 DIAGNOSIS — N879 Dysplasia of cervix uteri, unspecified: Secondary | ICD-10-CM

## 2011-07-20 DIAGNOSIS — O4290 Premature rupture of membranes, unspecified as to length of time between rupture and onset of labor, unspecified weeks of gestation: Secondary | ICD-10-CM | POA: Diagnosis present

## 2011-07-20 LAB — CBC
HCT: 35.8 % — ABNORMAL LOW (ref 36.0–46.0)
MCH: 32.7 pg (ref 26.0–34.0)
MCHC: 33.8 g/dL (ref 30.0–36.0)
MCV: 96.8 fL (ref 78.0–100.0)
RDW: 12.9 % (ref 11.5–15.5)

## 2011-07-20 LAB — TYPE AND SCREEN: Antibody Screen: NEGATIVE

## 2011-07-20 SURGERY — Surgical Case
Anesthesia: Regional

## 2011-07-20 SURGERY — Surgical Case
Anesthesia: Spinal | Site: Abdomen | Wound class: Clean Contaminated

## 2011-07-20 MED ORDER — IBUPROFEN 600 MG PO TABS
600.0000 mg | ORAL_TABLET | Freq: Four times a day (QID) | ORAL | Status: DC | PRN
  Administered 2011-07-21: 600 mg via ORAL
  Filled 2011-07-20 (×2): qty 1

## 2011-07-20 MED ORDER — OXYTOCIN 20 UNITS IN LACTATED RINGERS INFUSION - SIMPLE: 125.0000 mL/h | INTRAVENOUS | Status: AC

## 2011-07-20 MED ORDER — HYDROMORPHONE HCL PF 1 MG/ML IJ SOLN
0.2500 mg | INTRAMUSCULAR | Status: DC | PRN
  Administered 2011-07-20 (×2): 0.5 mg via INTRAVENOUS

## 2011-07-20 MED ORDER — CITRIC ACID-SODIUM CITRATE 334-500 MG/5ML PO SOLN
30.0000 mL | Freq: Once | ORAL | Status: AC
  Administered 2011-07-20: 30 mL via ORAL
  Filled 2011-07-20: qty 15

## 2011-07-20 MED ORDER — SODIUM CHLORIDE 0.9 % IJ SOLN: 3.0000 mL | INTRAMUSCULAR | Status: DC | PRN

## 2011-07-20 MED ORDER — BUPIVACAINE IN DEXTROSE 0.75-8.25 % IT SOLN
INTRATHECAL | Status: DC | PRN
  Administered 2011-07-20: 1.4 mL via INTRATHECAL

## 2011-07-20 MED ORDER — NALOXONE HCL 0.4 MG/ML IJ SOLN: 0.4000 mg | INTRAMUSCULAR | Status: DC | PRN

## 2011-07-20 MED ORDER — MENTHOL 3 MG MT LOZG: 1.0000 | LOZENGE | OROMUCOSAL | Status: DC | PRN

## 2011-07-20 MED ORDER — SODIUM CHLORIDE 0.9 % IJ SOLN
3.0000 mL | Freq: Two times a day (BID) | INTRAMUSCULAR | Status: DC
  Administered 2011-07-21: 3 mL via INTRAVENOUS

## 2011-07-20 MED ORDER — KETOROLAC TROMETHAMINE 60 MG/2ML IM SOLN
INTRAMUSCULAR | Status: AC
  Administered 2011-07-20: 60 mg via INTRAMUSCULAR
  Filled 2011-07-20: qty 2

## 2011-07-20 MED ORDER — METRONIDAZOLE 500 MG PO TABS
500.0000 mg | ORAL_TABLET | Freq: Two times a day (BID) | ORAL | Status: DC
  Administered 2011-07-21 – 2011-07-23 (×5): 500 mg via ORAL
  Filled 2011-07-20 (×6): qty 1

## 2011-07-20 MED ORDER — ONDANSETRON HCL 4 MG/2ML IJ SOLN
INTRAMUSCULAR | Status: DC | PRN
  Administered 2011-07-20: 4 mg via INTRAVENOUS

## 2011-07-20 MED ORDER — ZOLPIDEM TARTRATE 5 MG PO TABS: 5.0000 mg | ORAL_TABLET | Freq: Every evening | ORAL | Status: DC | PRN

## 2011-07-20 MED ORDER — MORPHINE SULFATE 0.5 MG/ML IJ SOLN
INTRAMUSCULAR | Status: AC
  Filled 2011-07-20: qty 10

## 2011-07-20 MED ORDER — DIPHENHYDRAMINE HCL 50 MG/ML IJ SOLN: 25.0000 mg | INTRAMUSCULAR | Status: DC | PRN

## 2011-07-20 MED ORDER — PROMETHAZINE HCL 25 MG/ML IJ SOLN: 6.2500 mg | INTRAMUSCULAR | Status: DC | PRN

## 2011-07-20 MED ORDER — MORPHINE SULFATE (PF) 0.5 MG/ML IJ SOLN
INTRAMUSCULAR | Status: DC | PRN
  Administered 2011-07-20: 2 mg via EPIDURAL
  Administered 2011-07-20: 2 mg via INTRAVENOUS
  Administered 2011-07-20: .2 mg via INTRATHECAL

## 2011-07-20 MED ORDER — DIBUCAINE 1 % RE OINT: 1.0000 "application " | TOPICAL_OINTMENT | RECTAL | Status: DC | PRN

## 2011-07-20 MED ORDER — SCOPOLAMINE 1 MG/3DAYS TD PT72
1.0000 | MEDICATED_PATCH | Freq: Once | TRANSDERMAL | Status: DC
Start: 2011-07-20 — End: 2011-07-23
  Administered 2011-07-20: 1.5 mg via TRANSDERMAL

## 2011-07-20 MED ORDER — LACTATED RINGERS IV SOLN
INTRAVENOUS | Status: DC | PRN
  Administered 2011-07-20 (×3): via INTRAVENOUS

## 2011-07-20 MED ORDER — HYDROMORPHONE HCL PF 1 MG/ML IJ SOLN
INTRAMUSCULAR | Status: AC
  Administered 2011-07-20: 0.5 mg via INTRAVENOUS
  Filled 2011-07-20: qty 1

## 2011-07-20 MED ORDER — MEPERIDINE HCL 25 MG/ML IJ SOLN
INTRAMUSCULAR | Status: AC
  Administered 2011-07-20: 6.25 mg via INTRAVENOUS
  Filled 2011-07-20: qty 1

## 2011-07-20 MED ORDER — ONDANSETRON HCL 4 MG/2ML IJ SOLN: 4.0000 mg | INTRAMUSCULAR | Status: DC | PRN

## 2011-07-20 MED ORDER — ONDANSETRON HCL 4 MG/2ML IJ SOLN: 4.0000 mg | Freq: Three times a day (TID) | INTRAMUSCULAR | Status: DC | PRN

## 2011-07-20 MED ORDER — KETOROLAC TROMETHAMINE 60 MG/2ML IM SOLN
60.0000 mg | Freq: Once | INTRAMUSCULAR | Status: AC | PRN
  Administered 2011-07-20: 60 mg via INTRAMUSCULAR

## 2011-07-20 MED ORDER — NALBUPHINE HCL 10 MG/ML IJ SOLN: 5.0000 mg | INTRAMUSCULAR | Status: DC | PRN

## 2011-07-20 MED ORDER — MEPERIDINE HCL 25 MG/ML IJ SOLN: 6.2500 mg | INTRAMUSCULAR | Status: DC | PRN

## 2011-07-20 MED ORDER — SCOPOLAMINE 1 MG/3DAYS TD PT72
MEDICATED_PATCH | TRANSDERMAL | Status: AC
  Administered 2011-07-20: 1.5 mg via TRANSDERMAL
  Filled 2011-07-20: qty 1

## 2011-07-20 MED ORDER — DIPHENHYDRAMINE HCL 25 MG PO CAPS: 25.0000 mg | ORAL_CAPSULE | ORAL | Status: DC | PRN

## 2011-07-20 MED ORDER — FAMOTIDINE IN NACL 20-0.9 MG/50ML-% IV SOLN: 20.0000 mg | Freq: Once | INTRAVENOUS | Status: DC

## 2011-07-20 MED ORDER — WITCH HAZEL-GLYCERIN EX PADS: 1.0000 "application " | MEDICATED_PAD | CUTANEOUS | Status: DC | PRN

## 2011-07-20 MED ORDER — MEPERIDINE HCL 25 MG/ML IJ SOLN
6.2500 mg | INTRAMUSCULAR | Status: DC | PRN
  Administered 2011-07-20 (×2): 6.25 mg via INTRAVENOUS

## 2011-07-20 MED ORDER — PRENATAL MULTIVITAMIN CH
1.0000 | ORAL_TABLET | Freq: Every day | ORAL | Status: DC
  Administered 2011-07-21 – 2011-07-23 (×3): 1 via ORAL
  Filled 2011-07-20 (×3): qty 1

## 2011-07-20 MED ORDER — OXYCODONE-ACETAMINOPHEN 5-325 MG PO TABS
1.0000 | ORAL_TABLET | ORAL | Status: DC | PRN
  Administered 2011-07-21: 1 via ORAL
  Administered 2011-07-21: 2 via ORAL
  Administered 2011-07-21: 1 via ORAL
  Administered 2011-07-21: 2 via ORAL
  Administered 2011-07-21: 1 via ORAL
  Administered 2011-07-21 – 2011-07-22 (×3): 2 via ORAL
  Administered 2011-07-22: 1 via ORAL
  Administered 2011-07-22 (×2): 2 via ORAL
  Administered 2011-07-23: 1 via ORAL
  Administered 2011-07-23 (×2): 2 via ORAL
  Filled 2011-07-20: qty 1
  Filled 2011-07-20 (×2): qty 2
  Filled 2011-07-20: qty 1
  Filled 2011-07-20 (×2): qty 2
  Filled 2011-07-20: qty 1
  Filled 2011-07-20 (×3): qty 2
  Filled 2011-07-20: qty 1
  Filled 2011-07-20: qty 2
  Filled 2011-07-20: qty 1
  Filled 2011-07-20: qty 2

## 2011-07-20 MED ORDER — KETOROLAC TROMETHAMINE 30 MG/ML IJ SOLN: 30.0000 mg | Freq: Four times a day (QID) | INTRAMUSCULAR | Status: AC | PRN

## 2011-07-20 MED ORDER — IBUPROFEN 600 MG PO TABS
600.0000 mg | ORAL_TABLET | Freq: Four times a day (QID) | ORAL | Status: DC
  Administered 2011-07-21 – 2011-07-23 (×9): 600 mg via ORAL
  Filled 2011-07-20 (×8): qty 1

## 2011-07-20 MED ORDER — SODIUM CHLORIDE 0.9 % IV SOLN: 250.0000 mL | INTRAVENOUS | Status: DC | PRN

## 2011-07-20 MED ORDER — SIMETHICONE 80 MG PO CHEW
80.0000 mg | CHEWABLE_TABLET | ORAL | Status: DC | PRN
  Administered 2011-07-21 – 2011-07-23 (×5): 80 mg via ORAL

## 2011-07-20 MED ORDER — OXYTOCIN 20 UNITS IN LACTATED RINGERS INFUSION - SIMPLE
INTRAVENOUS | Status: DC | PRN
  Administered 2011-07-20: 20 [IU] via INTRAVENOUS

## 2011-07-20 MED ORDER — FENTANYL CITRATE 0.05 MG/ML IJ SOLN
INTRAMUSCULAR | Status: DC | PRN
  Administered 2011-07-20: 50 ug via INTRAVENOUS
  Administered 2011-07-20: 25 ug via INTRATHECAL

## 2011-07-20 MED ORDER — SODIUM CHLORIDE 0.9 % IV SOLN: 1.0000 ug/kg/h | INTRAVENOUS | Status: DC | PRN

## 2011-07-20 MED ORDER — SENNOSIDES-DOCUSATE SODIUM 8.6-50 MG PO TABS
2.0000 | ORAL_TABLET | Freq: Every day | ORAL | Status: DC
  Administered 2011-07-21 – 2011-07-22 (×2): 2 via ORAL

## 2011-07-20 MED ORDER — MAGNESIUM HYDROXIDE 400 MG/5ML PO SUSP: 30.0000 mL | ORAL | Status: DC | PRN

## 2011-07-20 MED ORDER — BUPIVACAINE HCL (PF) 0.25 % IJ SOLN
INTRAMUSCULAR | Status: DC | PRN
  Administered 2011-07-20: 20 mL

## 2011-07-20 MED ORDER — LACTATED RINGERS IV SOLN: INTRAVENOUS | Status: DC

## 2011-07-20 MED ORDER — KETOROLAC TROMETHAMINE 30 MG/ML IJ SOLN: 15.0000 mg | Freq: Once | INTRAMUSCULAR | Status: DC | PRN

## 2011-07-20 MED ORDER — TETANUS-DIPHTH-ACELL PERTUSSIS 5-2.5-18.5 LF-MCG/0.5 IM SUSP: 0.5000 mL | Freq: Once | INTRAMUSCULAR | Status: DC

## 2011-07-20 MED ORDER — DIPHENHYDRAMINE HCL 50 MG/ML IJ SOLN: 12.5000 mg | INTRAMUSCULAR | Status: DC | PRN

## 2011-07-20 MED ORDER — LANOLIN HYDROUS EX OINT: 1.0000 "application " | TOPICAL_OINTMENT | CUTANEOUS | Status: DC | PRN

## 2011-07-20 MED ORDER — ONDANSETRON HCL 4 MG PO TABS: 4.0000 mg | ORAL_TABLET | ORAL | Status: DC | PRN

## 2011-07-20 MED ORDER — DIPHENHYDRAMINE HCL 25 MG PO CAPS: 25.0000 mg | ORAL_CAPSULE | Freq: Four times a day (QID) | ORAL | Status: DC | PRN

## 2011-07-20 MED ORDER — DEXTROSE 5 % IV SOLN
2.0000 g | INTRAVENOUS | Status: AC
  Administered 2011-07-20: 2 g via INTRAVENOUS
  Filled 2011-07-20: qty 2

## 2011-07-20 MED ORDER — KETOROLAC TROMETHAMINE 30 MG/ML IJ SOLN
30.0000 mg | Freq: Four times a day (QID) | INTRAMUSCULAR | Status: AC | PRN
  Administered 2011-07-20: 30 mg via INTRAVENOUS
  Filled 2011-07-20: qty 1

## 2011-07-20 MED ORDER — FENTANYL CITRATE 0.05 MG/ML IJ SOLN
INTRAMUSCULAR | Status: AC
  Filled 2011-07-20: qty 2

## 2011-07-20 SURGICAL SUPPLY — 33 items
CHLORAPREP W/TINT 26ML (MISCELLANEOUS) ×1 IMPLANT
CLOTH BEACON ORANGE TIMEOUT ST (SAFETY) ×1 IMPLANT
DRESSING TELFA 8X3 (GAUZE/BANDAGES/DRESSINGS) ×1 IMPLANT
ELECT REM PT RETURN 9FT ADLT (ELECTROSURGICAL)
ELECTRODE REM PT RTRN 9FT ADLT (ELECTROSURGICAL) ×1 IMPLANT
EXTRACTOR VACUUM M CUP 4 TUBE (SUCTIONS) IMPLANT
GAUZE SPONGE 4X4 12PLY STRL LF (GAUZE/BANDAGES/DRESSINGS) ×2 IMPLANT
GLOVE BIO SURGEON STRL SZ7 (GLOVE) ×1 IMPLANT
GLOVE BIOGEL PI IND STRL 7.0 (GLOVE) ×2 IMPLANT
GLOVE BIOGEL PI INDICATOR 7.0 (GLOVE)
GOWN PREVENTION PLUS LG XLONG (DISPOSABLE) ×3 IMPLANT
KIT ABG SYR 3ML LUER SLIP (SYRINGE) IMPLANT
NDL HYPO 25X5/8 SAFETYGLIDE (NEEDLE) ×1 IMPLANT
NEEDLE HYPO 22GX1.5 SAFETY (NEEDLE) ×1 IMPLANT
NEEDLE HYPO 25X5/8 SAFETYGLIDE (NEEDLE) IMPLANT
NS IRRIG 1000ML POUR BTL (IV SOLUTION) ×1 IMPLANT
PACK C SECTION WH (CUSTOM PROCEDURE TRAY) ×1 IMPLANT
PAD ABD 7.5X8 STRL (GAUZE/BANDAGES/DRESSINGS) IMPLANT
RTRCTR C-SECT PINK 25CM LRG (MISCELLANEOUS) IMPLANT
SLEEVE SCD COMPRESS KNEE LRG (MISCELLANEOUS) IMPLANT
SLEEVE SCD COMPRESS KNEE MED (MISCELLANEOUS) IMPLANT
STAPLER VISISTAT 35W (STAPLE) IMPLANT
SUT MNCRL 0 VIOLET CTX 36 (SUTURE) ×2 IMPLANT
SUT MONOCRYL 0 CTX 36 (SUTURE)
SUT PDS AB 0 CT1 27 (SUTURE) IMPLANT
SUT VIC AB 0 CT1 36 (SUTURE) ×2 IMPLANT
SUT VIC AB 2-0 CT1 27 (SUTURE)
SUT VIC AB 2-0 CT1 TAPERPNT 27 (SUTURE) IMPLANT
SUT VIC AB 4-0 KS 27 (SUTURE) ×1 IMPLANT
SYR CONTROL 10ML LL (SYRINGE) ×1 IMPLANT
TOWEL OR 17X24 6PK STRL BLUE (TOWEL DISPOSABLE) ×2 IMPLANT
TRAY FOLEY CATH 14FR (SET/KITS/TRAYS/PACK) ×1 IMPLANT
WATER STERILE IRR 1000ML POUR (IV SOLUTION) ×1 IMPLANT

## 2011-07-20 SURGICAL SUPPLY — 40 items
APL SKNCLS STERI-STRIP NONHPOA (GAUZE/BANDAGES/DRESSINGS) ×2
BENZOIN TINCTURE PRP APPL 2/3 (GAUZE/BANDAGES/DRESSINGS) ×2 IMPLANT
CHLORAPREP W/TINT 26ML (MISCELLANEOUS) ×2 IMPLANT
CLIP FILSHIE TUBAL LIGA STRL (Clip) ×1 IMPLANT
CLOSURE STERI STRIP 1/2 X4 (GAUZE/BANDAGES/DRESSINGS) ×1 IMPLANT
CLOTH BEACON ORANGE TIMEOUT ST (SAFETY) ×2 IMPLANT
DRESSING TELFA 8X3 (GAUZE/BANDAGES/DRESSINGS) ×2 IMPLANT
DRSG PAD ABDOMINAL 8X10 ST (GAUZE/BANDAGES/DRESSINGS) ×1 IMPLANT
ELECT REM PT RETURN 9FT ADLT (ELECTROSURGICAL) ×2
ELECTRODE REM PT RTRN 9FT ADLT (ELECTROSURGICAL) ×1 IMPLANT
EXTRACTOR VACUUM M CUP 4 TUBE (SUCTIONS) IMPLANT
GAUZE SPONGE 4X4 12PLY STRL LF (GAUZE/BANDAGES/DRESSINGS) ×4 IMPLANT
GLOVE BIO SURGEON STRL SZ7 (GLOVE) ×2 IMPLANT
GLOVE BIOGEL PI IND STRL 7.0 (GLOVE) ×2 IMPLANT
GLOVE BIOGEL PI INDICATOR 7.0 (GLOVE) ×2
GOWN PREVENTION PLUS LG XLONG (DISPOSABLE) ×6 IMPLANT
KIT ABG SYR 3ML LUER SLIP (SYRINGE) IMPLANT
NDL HYPO 25X5/8 SAFETYGLIDE (NEEDLE) ×1 IMPLANT
NEEDLE HYPO 22GX1.5 SAFETY (NEEDLE) ×2 IMPLANT
NEEDLE HYPO 25X5/8 SAFETYGLIDE (NEEDLE) ×2 IMPLANT
NS IRRIG 1000ML POUR BTL (IV SOLUTION) ×2 IMPLANT
PACK C SECTION WH (CUSTOM PROCEDURE TRAY) ×2 IMPLANT
PAD ABD 7.5X8 STRL (GAUZE/BANDAGES/DRESSINGS) ×1 IMPLANT
RTRCTR C-SECT PINK 25CM LRG (MISCELLANEOUS) IMPLANT
SLEEVE SCD COMPRESS KNEE LRG (MISCELLANEOUS) IMPLANT
SLEEVE SCD COMPRESS KNEE MED (MISCELLANEOUS) ×1 IMPLANT
SPONGE GAUZE 4X4 12PLY (GAUZE/BANDAGES/DRESSINGS) ×1 IMPLANT
STAPLER VISISTAT 35W (STAPLE) IMPLANT
SUT MNCRL 0 VIOLET CTX 36 (SUTURE) ×2 IMPLANT
SUT MONOCRYL 0 CTX 36 (SUTURE) ×2
SUT PDS AB 0 CT1 27 (SUTURE) IMPLANT
SUT VIC AB 0 CT1 36 (SUTURE) ×4 IMPLANT
SUT VIC AB 2-0 CT1 27 (SUTURE)
SUT VIC AB 2-0 CT1 TAPERPNT 27 (SUTURE) IMPLANT
SUT VIC AB 4-0 KS 27 (SUTURE) ×2 IMPLANT
SYR CONTROL 10ML LL (SYRINGE) ×2 IMPLANT
TAPE CLOTH SURG 4X10 WHT LF (GAUZE/BANDAGES/DRESSINGS) ×1 IMPLANT
TOWEL OR 17X24 6PK STRL BLUE (TOWEL DISPOSABLE) ×4 IMPLANT
TRAY FOLEY CATH 14FR (SET/KITS/TRAYS/PACK) ×2 IMPLANT
WATER STERILE IRR 1000ML POUR (IV SOLUTION) ×1 IMPLANT

## 2011-07-20 NOTE — Anesthesia Procedure Notes (Signed)
Spinal  Patient location during procedure: OR Start time: 07/20/2011 4:19 PM End time: 07/20/2011 4:22 PM Staffing Anesthesiologist: Sandrea Hughs Performed by: anesthesiologist  Preanesthetic Checklist Completed: patient identified, site marked, surgical consent, pre-op evaluation, timeout performed, IV checked, risks and benefits discussed and monitors and equipment checked Spinal Block Patient position: sitting Prep: DuraPrep Patient monitoring: heart rate, cardiac monitor, continuous pulse ox and blood pressure Approach: midline Location: L3-4 Injection technique: single-shot Needle Needle type: Sprotte  Needle gauge: 24 G Needle length: 9 cm Needle insertion depth: 5 cm Assessment Sensory level: T4

## 2011-07-20 NOTE — Transfer of Care (Signed)
Immediate Anesthesia Transfer of Care Note  Patient: Haley Wiley  Procedure(s) Performed:  CESAREAN SECTION WITH BILATERAL TUBAL LIGATION  Patient Location: PACU  Anesthesia Type: Spinal  Level of Consciousness: awake, alert  and oriented  Airway & Oxygen Therapy: Patient Spontanous Breathing  Post-op Assessment: Report given to PACU RN  Post vital signs: Reviewed and stable  Complications: No apparent anesthesia complications

## 2011-07-20 NOTE — Anesthesia Postprocedure Evaluation (Signed)
Anesthesia Post Note  Patient: Haley Wiley  Procedure(s) Performed:  CESAREAN SECTION WITH BILATERAL TUBAL LIGATION  Anesthesia type: Spinal  Patient location: PACU  Post pain: Pain level controlled  Post assessment: Post-op Vital signs reviewed  Last Vitals:  Filed Vitals:   07/20/11 1815  BP: 110/85  Pulse: 71  Temp: 36.6 C  Resp: 24    Post vital signs: Reviewed  Level of consciousness: awake  Complications: No apparent anesthesia complications

## 2011-07-20 NOTE — Anesthesia Preprocedure Evaluation (Signed)
Anesthesia Evaluation  Patient identified by MRN, date of birth, ID band Patient awake    Reviewed: Allergy & Precautions, H&P , NPO status , Patient's Chart, lab work & pertinent test results  Airway Mallampati: I TM Distance: >3 FB Neck ROM: full    Dental No notable dental hx.    Pulmonary neg pulmonary ROS,    Pulmonary exam normal       Cardiovascular neg cardio ROS     Neuro/Psych Negative Neurological ROS  Negative Psych ROS   GI/Hepatic negative GI ROS, Neg liver ROS,   Endo/Other  Negative Endocrine ROS  Renal/GU negative Renal ROS  Genitourinary negative   Musculoskeletal   Abdominal Normal abdominal exam  (+)   Peds negative pediatric ROS (+)  Hematology negative hematology ROS (+)   Anesthesia Other Findings   Reproductive/Obstetrics (+) Pregnancy                           Anesthesia Physical Anesthesia Plan  ASA: II  Anesthesia Plan: Spinal   Post-op Pain Management:    Induction:   Airway Management Planned:   Additional Equipment:   Intra-op Plan:   Post-operative Plan:   Informed Consent: I have reviewed the patients History and Physical, chart, labs and discussed the procedure including the risks, benefits and alternatives for the proposed anesthesia with the patient or authorized representative who has indicated his/her understanding and acceptance.     Plan Discussed with:   Anesthesia Plan Comments:         Anesthesia Quick Evaluation

## 2011-07-20 NOTE — Op Note (Signed)
Haley Wiley PROCEDURE DATE: 07/20/2011  PREOPERATIVE DIAGNOSIS: Intrauterine pregnancy at  [redacted]w[redacted]d weeks gestation; prior cesarean sections x 2, undesired fertility  POSTOPERATIVE DIAGNOSIS: The same  PROCEDURE: Repeat Low Transverse Cesarean Section  And Bilateral Tubal Sterilization using Filshie clips  SURGEON:  Dr. Jaynie Collins  ASSISTANT:  Dr. Candelaria Celeste  ANESTHESIOLOGIST: Sandrea Hughs., MD  INDICATIONS: Haley Wiley is a 25 y.o. [phone] at [redacted]w[redacted]d scheduled for cesarean section and bilateral tubal sterilization secondary to prior cesarean sections x 2 and undesired fertility .  The risks of repeat cesarean section and bilateral tubal sterilization discussed with the patient included but were not limited to: bleeding which may require transfusion or reoperation; infection which may require antibiotics; injury to bowel, bladder, ureters or other surrounding organs; injury to the fetus; need for additional procedures including hysterectomy in the event of a life-threatening hemorrhage; placental abnormalities wth subsequent pregnancies, incisional problems, thromboembolic phenomenon and other postoperative/anesthesia complications.  Patient also desires permanent sterilization. Other reversible forms of contraception were discussed with patient; she declines all other modalities. Risks of procedure discussed with patient including but not limited to: risk of regret, permanence of method, bleeding, infection, injury to surrounding organs and need for additional procedures. Failure risk of 0.5-1% with increased risk of ectopic gestation if pregnancy occurs was also discussed with patient.  he patient concurred with the proposed plan, giving informed written consent for the procedures.    FINDINGS:  Viable female infant in cephalic presentation.  Apgars 9 and 9, weight, 5 pounds and 13 ounces.  Loose nuchal cord x 1. Clear amniotic fluid.  Intact placenta, three vessel  cord.  Normal uterus, fallopian tubes and ovaries bilaterally.  ANESTHESIA: Spinal INTRAVENOUS FLUIDS: 1500 ml ESTIMATED BLOOD LOSS: 600 ml URINE OUTPUT:  100 ml SPECIMENS: Placenta sent to L&D COMPLICATIONS: None immediate  PROCEDURE IN DETAIL:  The patient received intravenous antibiotics and had sequential compression devices applied to her lower extremities while in the preoperative area.  She was then taken to the operating room where spinal anesthesia was administered and was found to be adequate. She was then placed in a dorsal supine position with a leftward tilt, and prepped and draped in a sterile manner.  A foley catheter was placed into her bladder and attached to constant gravity.  After an adequate timeout was performed, a Pfannenstiel skin incision was made with scalpel and carried through to the underlying layer of fascia. The fascia was incised in the midline, and this incision was extended bilaterally using the Mayo scissors.  Kocher clamps were applied to the superior aspect of the fascial incision and the underlying rectus muscles were dissected off bluntly. A similar process was carried out on the inferior aspect of the facial incision. The rectus muscles were separated in the midline bluntly and the peritoneum was entered bluntly. Attention was turned to the lower uterine segment where a low transverse hysterotomy was made with a scalpel and extended bilaterally bluntly.  The infant was successfully delivered, the cord was clamped and cut and the infant was handed over to awaiting neonatology team. Uterine massage was then administered, and the placenta delivered intact with a three-vessel cord. The uterus was then cleared of clot and debris.  The hysterotomy was closed with 0 Monocryl in a running locked fashion, and an imbricating layer was also placed with a 0 Monocryl suture. The pelvis was cleared of all clot and debris. Hemostasis was confirmed on all surfaces.  Attention was  then turned to the fallopian tubes.  A Filshie clip was placed on both fallopian tubes, about 2 cm from the cornua, with care given to incorporate the underlying mesosalpinx on both sides, allowing for bilateral tubal sterilization.  The peritoneum and the muscles were reapproximated using 0 Monocryl interrupted stitches. The fascia was then closed using 0 PDS in a running fashion.  The subcutaneous layer was irrigated, then reapproximated with 2-0 plain gut, and the skin was closed with a 4-0 Vicryl subcuticular stitch. The patient tolerated the procedure well. Sponge, lap, instrument and needle counts were correct x 2.  She was taken to the recovery room in stable condition.

## 2011-07-20 NOTE — Progress Notes (Signed)
Patient arrived by EMS with c/o SROM at 0900, leaking a moderate amount of clear fluid. Dr. Adrian Blackwater in room on patients arrival and discussed cesarean section. EMS has a saline lock in the left antecubital with a 20 angio.

## 2011-07-20 NOTE — ED Provider Notes (Signed)
Please see H&P for admission details.  Jaynie Collins, M.D. 07/20/2011 12:28 PM

## 2011-07-20 NOTE — H&P (Signed)
Obstetric History and Physical  DELANEE XIN is a 25 y.o. female [phone] with IUP at [redacted]w[redacted]d presenting for  PROM. She is scheduled for repeat cesarean section and BTS at [redacted] weeks GA.  Pt states she has been having rare contractions, no vaginal bleeding, ruptured membranes, with active fetal movement.    Prenatal Course Source of Care: [facility]  with onset of care at 24 weeks Pregnancy complications or risks: Patient Active Problem List  Diagnoses  . Herpes genitalia  . Herpes genitalia  . Cervical dysplasia  . Supervision of normal pregnancy  . Previous cesarean delivery, antepartum condition or complication  . Smoker  . Tobacco use disorder complicating pregnancy, childbirth, or the puerperium, antepartum condition or complication   Prenatal labs and studies: ABO, Rh: --/--/O POS (12/28 1137) Antibody: PENDING (12/28 1137) Rubella:  immune RPR: NON REACTIVE (11/01 1830)  HBsAg: NEGATIVE (11/01 1830)  HIV:   GBS:  neg 1 hr Glucola 122 Genetic screening too late Anatomy US normal  Past Medical History:  Past Medical History  Diagnosis Date  . Herpes genitalia     last outbreak early 2012  . Previous cesarean delivery, antepartum condition or complication 07/05/2011  . Tobacco use disorder complicating pregnancy, childbirth, or the puerperium, antepartum condition or complication 07/11/2011   Past Surgical History:  Past Surgical History  Procedure Date  . Cesarean section     2   Obstetrical History:  OB History    Grav Para Term Preterm Abortions TAB SAB Ect Mult Living   4 2 2  0 1 0 1 0 0 2     Gynecological History: Genital herpes  Social History:  History   Social History  . Marital Status: Legally Separated    Spouse Name: N/A    Number of Children: N/A  . Years of Education: N/A   Social History Main Topics  . Smoking status: Current Some Day Smoker -- 0.2 packs/day for 4 years    Types: Cigarettes  . Smokeless tobacco: Never Used  .  Alcohol Use: No  . Drug Use: No  . Sexually Active: Yes    Birth Control/ Protection: None   Other Topics Concern  . None   Social History Narrative  . None   Family History:  Family History  Problem Relation Age of Onset  . Cancer Maternal Grandmother     lung  . Cancer Maternal Grandfather     lung  . Cancer Paternal Grandfather     lung   Medications:  Prenatal vitamins,  Current Facility-Administered Medications  Medication Dose Route Frequency Provider Last Rate Last Dose  . cefoTEtan (CEFOTAN) 2 g in dextrose 5 % 50 mL IVPB  2 g Intravenous On Call to OR Ugonna A Anyanwu, MD      . citric acid-sodium citrate (ORACIT) solution 30 mL  30 mL Oral Once Sandrea Hughs., MD      . famotidine (PEPCID) IVPB 20 mg  20 mg Intravenous Once Sandrea Hughs., MD      . lactated ringers infusion   Intravenous Continuous Tereso Newcomer, MD       Allergies: No Known Allergies  Review of Systems: Negative  Physical Exam: Blood pressure 110/72, pulse 94, temperature 98 F (36.7 C), temperature source Oral, resp. rate 20, height 5' 4.5" (1.638 m), weight 60.328 kg (133 lb), last menstrual period 10/12/2010, SpO2 99.00%. GENERAL: Well-developed, well-nourished female in no acute distress.  LUNGS: Clear to auscultation  bilaterally.  HEART: Regular rate and rhythm. ABDOMEN: Soft, nontender, nondistended, gravid.  EXTREMITIES: Nontender, no edema, 2+ distal pulses. Cervical Exam: Grossly ruptured FHT:  Baseline rate 150 bpm   Variability moderate  Accelerations present   Decelerations none Contractions: Rare   Pertinent Labs/Studies:   Results for orders placed during the hospital encounter of 07/20/11 (from the past 24 hour(s))  TYPE AND SCREEN     Status: Normal (Preliminary result)   Collection Time   07/20/11 11:37 AM      Component Value Range   ABO/RH(D) O POS     Antibody Screen PENDING     Sample Expiration 07/23/2011    CBC     Status: Abnormal    Collection Time   07/20/11 11:37 AM      Component Value Range   WBC 13.9 (*) 4.0 - 10.5 (K/uL)   RBC 3.70 (*) 3.87 - 5.11 (MIL/uL)   Hemoglobin 12.1  12.0 - 15.0 (g/dL)   HCT 16.1 (*) 09.6 - 46.0 (%)   MCV 96.8  78.0 - 100.0 (fL)   MCH 32.7  26.0 - 34.0 (pg)   MCHC 33.8  30.0 - 36.0 (g/dL)   RDW 04.5  40.9 - 81.1 (%)   Platelets 261  150 - 400 (K/uL)   Assessment : CAMERIN JIMENEZ is a 25 y.o. [id number] at [redacted]w[redacted]d being admitted for repeat cesarean section and bilateral tubal sterilization secondary to PROM.  Plan: The risks of repeat cesarean section and bilateral tubal sterilization discussed with the patient included but were not limited to: bleeding which may require transfusion or reoperation; infection which may require antibiotics; injury to bowel, bladder, ureters or other surrounding organs; injury to the fetus; need for additional procedures including hysterectomy in the event of a life-threatening hemorrhage; placental abnormalities wth subsequent pregnancies, incisional problems, thromboembolic phenomenon and other postoperative/anesthesia complications.   Patient desires permanent sterilization.  Other reversible forms of contraception were discussed with patient; she declines all other modalities. Risks of procedure discussed with patient including but not limited to: risk of regret, permanence of method, bleeding, infection, injury to surrounding organs and need for additional procedures.  Failure risk of 0.5-1% with increased risk of ectopic gestation if pregnancy occurs was also discussed with patient.   Patient verbalized understanding of these risks and wants to proceed with sterilization.    The patient concurred with the proposed procedures, giving informed written consent.   Patient has been NPO since 0930 she will remain NPO for procedure. Anesthesia and OR aware.  Preoperative prophylactic Cefotetan ordered on call to the OR.  To OR when ready  Jaynie Collins,  M.D. 07/20/2011 12:34 PM

## 2011-07-20 NOTE — Consult Note (Signed)
Neonatology Note:  Attendance at C-section:  I was asked to attend this repeat C/S at 37 3/7 weeks. The mother is a G4P2A1 O pos, GBS neg with onset of labor and SROM today. Fluid clear. Infant vigorous with good spontaneous cry and tone. Needed only minimal bulb suctioning. Ap 9/9. Lungs clear to ausc in DR. To CN to care of Pediatrician.  Leoda Smithhart, MD  

## 2011-07-21 LAB — CBC
Hemoglobin: 9.6 g/dL — ABNORMAL LOW (ref 12.0–15.0)
MCV: 97.3 fL (ref 78.0–100.0)
Platelets: 201 10*3/uL (ref 150–400)
RBC: 2.96 MIL/uL — ABNORMAL LOW (ref 3.87–5.11)
WBC: 11.5 10*3/uL — ABNORMAL HIGH (ref 4.0–10.5)

## 2011-07-21 NOTE — Anesthesia Postprocedure Evaluation (Signed)
  Anesthesia Post-op Note  Patient: Haley Wiley  Procedure(s) Performed:  CESAREAN SECTION WITH BILATERAL TUBAL LIGATION  Patient Location: Mother/Baby  Anesthesia Type: Spinal  Level of Consciousness: alert  and oriented  Airway and Oxygen Therapy: Patient Spontanous Breathing  Post-op Pain: mild  Post-op Assessment: Patient's Cardiovascular Status Stable and Respiratory Function Stable  Post-op Vital Signs: stable  Complications: No apparent anesthesia complications

## 2011-07-21 NOTE — Progress Notes (Signed)
Subjective: Postpartum Day 1: Cesarean Delivery Patient reports incisional pain, tolerating PO and no problems voiding.    Objective: Vital signs in last 24 hours: Temp:  [97.4 F (36.3 C)-98.8 F (37.1 C)] 97.4 F (36.3 C) (12/29 0611) Pulse Rate:  [58-94] 68  (12/29 0611) Resp:  [16-24] 18  (12/29 0611) BP: (91-110)/(54-85) 91/57 mmHg (12/29 0611) SpO2:  [98 %-100 %] 99 % (12/29 0611) Weight:  [60.328 kg (133 lb)] 133 lb (60.328 kg) (12/28 1103)  Physical Exam:  General: alert, cooperative and no distress Lochia: appropriate Uterine Fundus: firm Incision: no significant drainage DVT Evaluation: No evidence of DVT seen on physical exam.   Basename 07/21/11 0531 07/20/11 1137  HGB 9.6* 12.1  HCT 28.8* 35.8*    Assessment/Plan: Status post Cesarean section. Doing well postoperatively.  Continue current care.  Continue pain control, progressive ambulation.  STINSON, JACOB JEHIEL 07/21/2011, 6:16 AM

## 2011-07-21 NOTE — Addendum Note (Signed)
Addendum  created 07/21/11 0813 by Fanny Dance   Modules edited:Notes Section

## 2011-07-22 MED ORDER — BISACODYL 10 MG RE SUPP
10.0000 mg | Freq: Every day | RECTAL | Status: DC
  Administered 2011-07-22: 10 mg via RECTAL
  Filled 2011-07-22: qty 1

## 2011-07-22 NOTE — Progress Notes (Signed)
Subjective: Postpartum Day #2: Cesarean Delivery & BTL Patient reports incisional pain, tolerating PO and no problems voiding.  Also with some back discomfort but no dysuria. Bottlefeeding. Desires d/c tomorrow.  Objective: Vital signs in last 24 hours: Temp:  [97.3 F (36.3 C)-98.2 F (36.8 C)] 98.2 F (36.8 C) (12/30 0540) Pulse Rate:  [67-79] 79  (12/30 0540) Resp:  [16-18] 18  (12/30 0540) BP: (87-107)/(59-69) 105/69 mmHg (12/30 0540) SpO2:  [98 %-99 %] 98 % (12/29 1215)  Physical Exam:  General: alert, cooperative and mild distress Lochia: appropriate Uterine Fundus: firm Incision: healing well, no significant drainage, no significant erythema DVT Evaluation: No evidence of DVT seen on physical exam.   Basename 07/21/11 0531 07/20/11 1137  HGB 9.6* 12.1  HCT 28.8* 35.8*    Assessment/Plan: Status post Cesarean section. Doing well postoperatively.  Continue current care and plan for d/c in AM on 12/31.  Cam Hai 07/22/2011, 7:19 AM

## 2011-07-23 ENCOUNTER — Encounter (HOSPITAL_COMMUNITY): Payer: Self-pay | Admitting: Obstetrics & Gynecology

## 2011-07-23 MED ORDER — COMPLETENATE 29-1 MG PO CHEW: 1.0000 | CHEWABLE_TABLET | Freq: Every day | ORAL | Status: DC

## 2011-07-23 MED ORDER — OXYCODONE-ACETAMINOPHEN 5-325 MG PO TABS: 1.0000 | ORAL_TABLET | ORAL | Status: DC | PRN

## 2011-07-23 MED ORDER — IBUPROFEN 600 MG PO TABS: 600.0000 mg | ORAL_TABLET | Freq: Four times a day (QID) | ORAL | Status: AC | PRN

## 2011-07-23 NOTE — Progress Notes (Signed)
Subjective: Postpartum Day 3: Cesarean Delivery Patient reports incisional pain, tolerating PO, + BM and no problems voiding.    Objective: Vital signs in last 24 hours: Temp:  [97.6 F (36.4 C)-98.2 F (36.8 C)] 98.2 F (36.8 C) (12/31 0541) Pulse Rate:  [62-86] 62  (12/31 0541) Resp:  [18-20] 18  (12/31 0541) BP: (106-117)/(72-77) 106/72 mmHg (12/31 0541)  Physical Exam:  General: alert, cooperative and no distress Lochia: appropriate Uterine Fundus: firm Incision: healing well, some dried blood below steri strips on right, no active bleeding, wound separation or drainage seen DVT Evaluation: No evidence of DVT seen on physical exam.   Basename 07/21/11 0531 07/20/11 1137  HGB 9.6* 12.1  HCT 28.8* 35.8*    Assessment/Plan: ABL anemia Status post Cesarean section. Doing well postoperatively.  Discharge home with standard precautions and return to clinic in 4-6 weeks.  Haley Wiley 07/23/2011, 10:22 AM

## 2011-07-23 NOTE — Progress Notes (Signed)
UR chart review completed.  

## 2011-07-23 NOTE — Discharge Summary (Signed)
Obstetric Discharge Summary Reason for Admission: cesarean section elective repeat Prenatal Procedures: NST and ultrasound Intrapartum Procedures: cesarean: low cervical, transverse Postpartum Procedures: none Complications-Operative and Postpartum: none Hemoglobin  Date Value Range Status  07/21/2011 9.6* 12.0-15.0 (g/dL) Final     DELTA CHECK NOTED     REPEATED TO VERIFY     HCT  Date Value Range Status  07/21/2011 28.8* 36.0-46.0 (%) Final    Discharge Diagnoses: Term Pregnancy-delivered  Discharge Information: Date: 07/23/2011 Activity: pelvic rest Diet: routine Medications: PNV, Ibuprofen and Percocet iron Condition: stable Instructions: refer to practice specific booklet Discharge to: home Follow-up Information    Follow up with [facility]. Make an appointment in 4 weeks. (Someone will call with your appointment)        No results found for this or any previous visit (from the past 24 hour(s)). Results for orders placed during the hospital encounter of 07/20/11 (from the past 72 hour(s))  TYPE AND SCREEN     Status: Normal   Collection Time   07/20/11 11:37 AM      Component Value Range Comment   ABO/RH(D) O POS      Antibody Screen NEG      Sample Expiration 07/23/2011     CBC     Status: Abnormal   Collection Time   07/20/11 11:37 AM      Component Value Range Comment   WBC 13.9 (*) 4.0 - 10.5 (K/uL)    RBC 3.70 (*) 3.87 - 5.11 (MIL/uL)    Hemoglobin 12.1  12.0 - 15.0 (g/dL)    HCT 16.1 (*) 09.6 - 46.0 (%)    MCV 96.8  78.0 - 100.0 (fL)    MCH 32.7  26.0 - 34.0 (pg)    MCHC 33.8  30.0 - 36.0 (g/dL)    RDW 04.5  40.9 - 81.1 (%)    Platelets 261  150 - 400 (K/uL)   RPR     Status: Normal   Collection Time   07/20/11 11:37 AM      Component Value Range Comment   RPR NON REACTIVE  NON REACTIVE    CBC     Status: Abnormal   Collection Time   07/21/11  5:31 AM      Component Value Range Comment   WBC 11.5 (*) 4.0 - 10.5 (K/uL)    RBC 2.96 (*) 3.87 -  5.11 (MIL/uL)    Hemoglobin 9.6 (*) 12.0 - 15.0 (g/dL)    HCT 91.4 (*) 78.2 - 46.0 (%)    MCV 97.3  78.0 - 100.0 (fL)    MCH 32.4  26.0 - 34.0 (pg)    MCHC 33.3  30.0 - 36.0 (g/dL)    RDW 95.6  21.3 - 08.6 (%)    Platelets 201  150 - 400 (K/uL)    Newborn Data: Live born female  Birth Weight: 5 lb 13.1 oz (2640 g) APGAR: 9, 9  Home with mother.  Haley Wiley 07/23/2011, 10:16 AM

## 2011-07-23 NOTE — Discharge Summary (Signed)
Agree with above note.  Henritta Mutz 07/23/2011 1:07 PM

## 2011-07-25 ENCOUNTER — Encounter: Payer: Self-pay | Admitting: Obstetrics and Gynecology

## 2011-07-26 ENCOUNTER — Other Ambulatory Visit (HOSPITAL_COMMUNITY): Payer: Medicaid Other

## 2011-07-26 ENCOUNTER — Telehealth: Payer: Self-pay | Admitting: *Deleted

## 2011-07-26 DIAGNOSIS — Z98891 History of uterine scar from previous surgery: Secondary | ICD-10-CM

## 2011-07-26 NOTE — Telephone Encounter (Signed)
Pt left message stating that she needs a refill on her medicine. (did not specify)

## 2011-07-27 MED ORDER — OXYCODONE-ACETAMINOPHEN 5-325 MG PO TABS: 1.0000 | ORAL_TABLET | ORAL | Status: AC | PRN

## 2011-07-27 NOTE — Telephone Encounter (Signed)
I spoke with patient and she wants a refill of percocet. She is using motrin and it does help. She filled the percocet on 07/23/11. I advised her that I would check with a physician in the clinic and see if they would authorize a refill. Per Dr Marice Potter okay to refill this med, but no additional will be given. Pt agrees, her mother will come by and pick up the prescription today if she can get off in time otherwise she will get it Monday.

## 2011-07-31 ENCOUNTER — Encounter (HOSPITAL_COMMUNITY): Admission: RE | Payer: Self-pay | Source: Ambulatory Visit

## 2011-07-31 ENCOUNTER — Inpatient Hospital Stay (HOSPITAL_COMMUNITY)
Admission: RE | Admit: 2011-07-31 | Payer: Medicaid Other | Source: Ambulatory Visit | Admitting: Obstetrics & Gynecology

## 2011-07-31 SURGERY — Surgical Case
Anesthesia: Spinal | Laterality: Bilateral

## 2011-08-10 IMAGING — CR DG CHEST 2V
2 series · 2 of 2 positions shown · non-contrast
Comparison: None.

CLINICAL DATA: Pain

CHEST - 2 VIEW

[w chest pa]
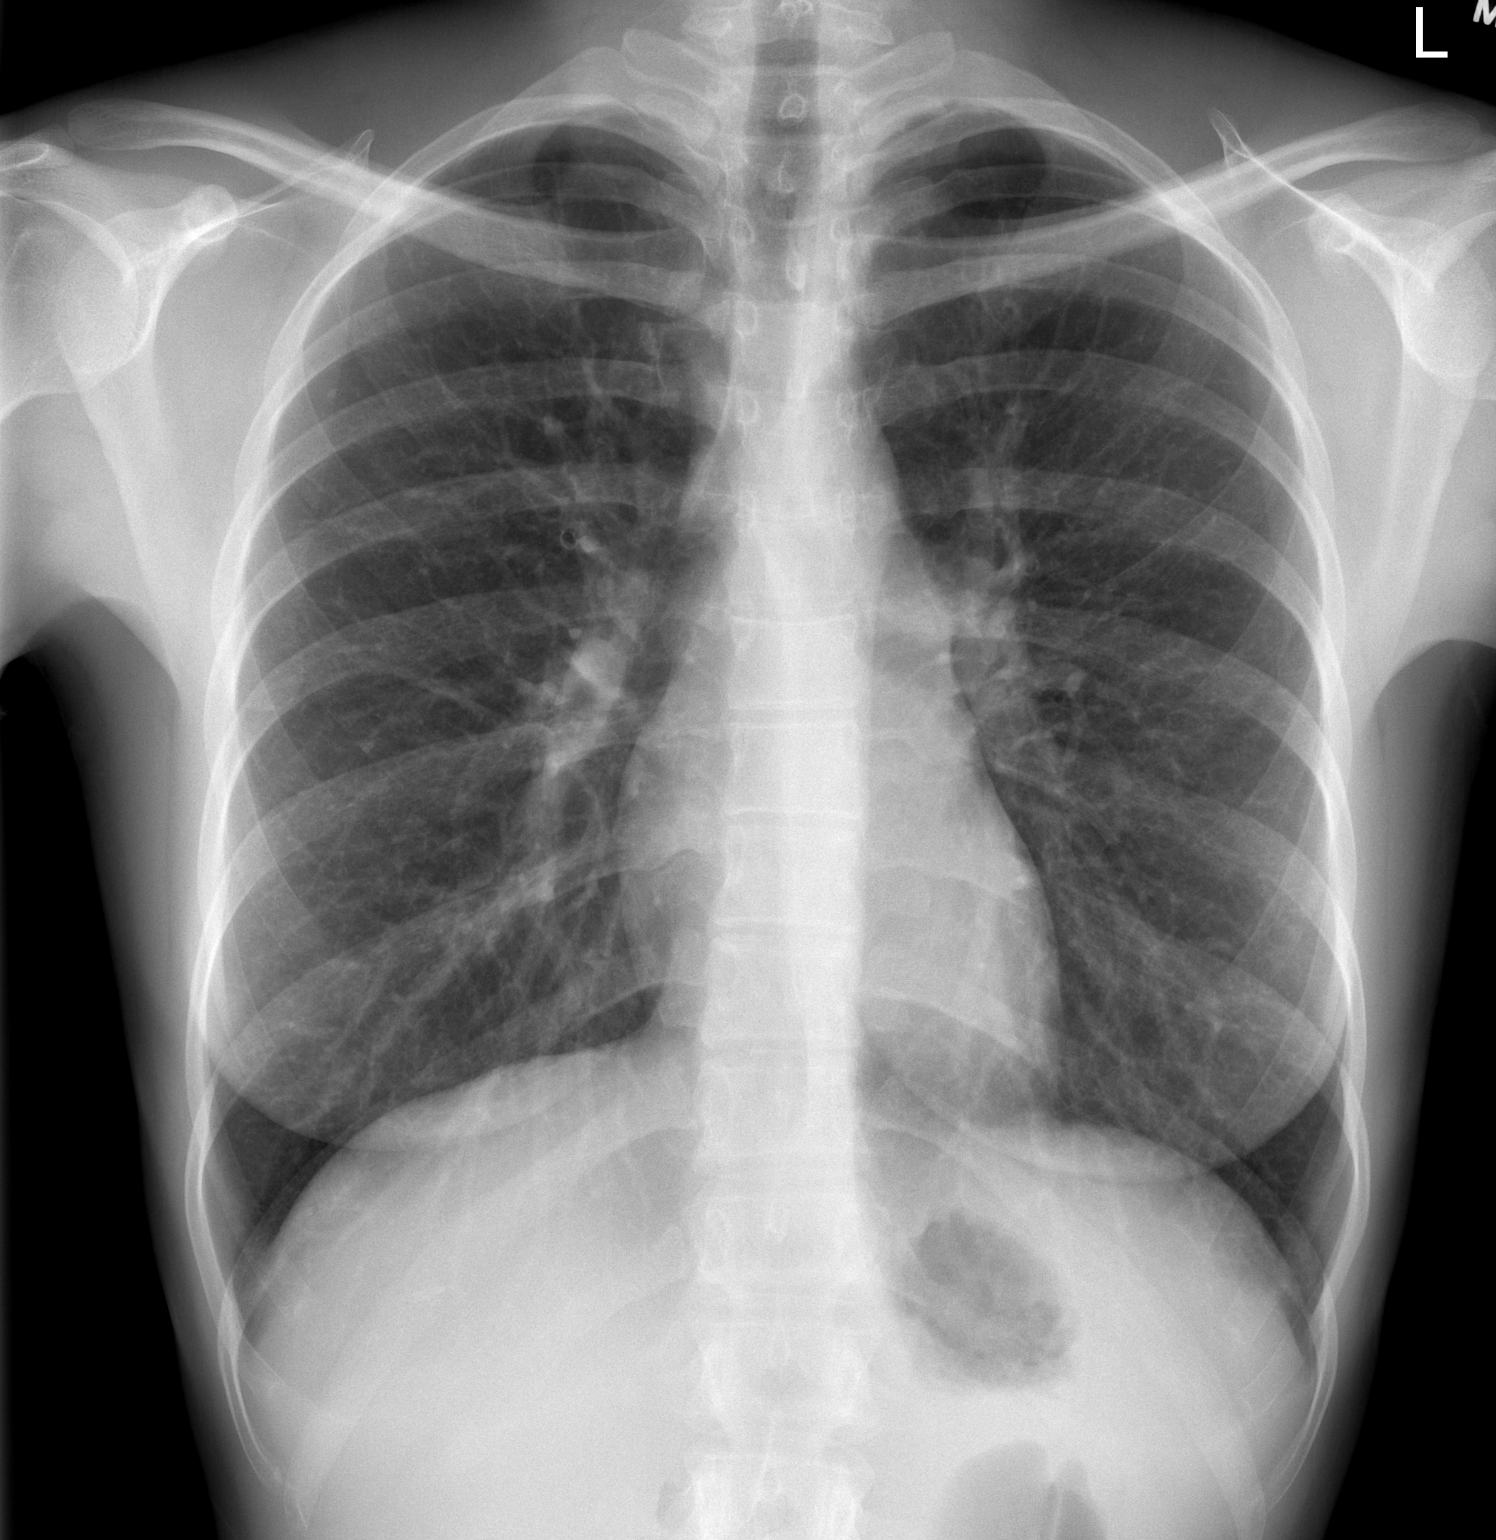

[w chest lat]
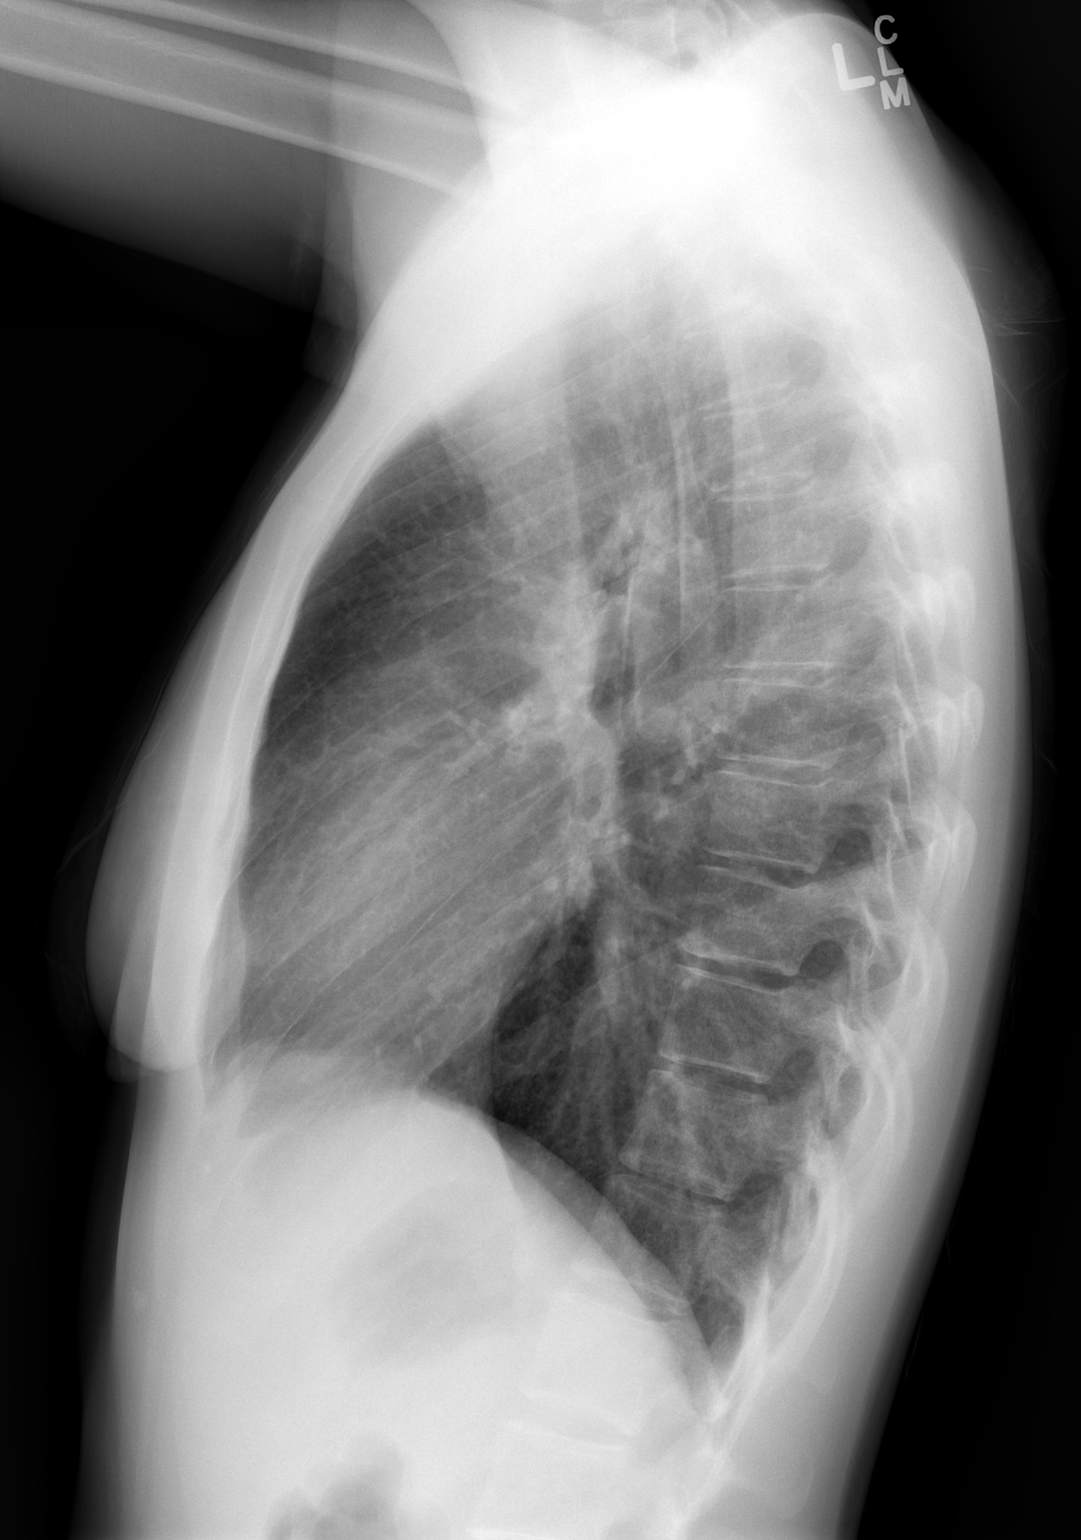

[2 of 2 positions shown; findings below may reference images not displayed]

FINDINGS: The lungs are clear bilaterally.  No confluent airspace
opacities, pleural effuions or pneumothoracies are seen.  Sub
centimeter nodules are seen in the right upper lung, likely, non
calcified granulomas.  The heart is normal in size and contour.
The upper abdomen and osseous structures are normal.
IMPRESSION: No acute cardiopulmonary disease.

## 2011-09-05 ENCOUNTER — Ambulatory Visit: Payer: Medicaid Other | Admitting: Advanced Practice Midwife

## 2012-02-07 ENCOUNTER — Encounter (HOSPITAL_BASED_OUTPATIENT_CLINIC_OR_DEPARTMENT_OTHER): Payer: Self-pay | Admitting: *Deleted

## 2012-02-07 ENCOUNTER — Emergency Department (HOSPITAL_BASED_OUTPATIENT_CLINIC_OR_DEPARTMENT_OTHER)
Admission: EM | Admit: 2012-02-07 | Discharge: 2012-02-07 | Disposition: A | Discharge status: Home / Self Care | Payer: Medicaid Other | Attending: Emergency Medicine | Admitting: Emergency Medicine

## 2012-02-07 DIAGNOSIS — F17200 Nicotine dependence, unspecified, uncomplicated: Secondary | ICD-10-CM | POA: Insufficient documentation

## 2012-02-07 DIAGNOSIS — K0889 Other specified disorders of teeth and supporting structures: Secondary | ICD-10-CM

## 2012-02-07 DIAGNOSIS — X58XXXA Exposure to other specified factors, initial encounter: Secondary | ICD-10-CM | POA: Insufficient documentation

## 2012-02-07 DIAGNOSIS — S025XXA Fracture of tooth (traumatic), initial encounter for closed fracture: Secondary | ICD-10-CM | POA: Insufficient documentation

## 2012-02-07 MED ORDER — PENICILLIN V POTASSIUM 500 MG PO TABS: 500.0000 mg | ORAL_TABLET | Freq: Four times a day (QID) | ORAL | Status: AC

## 2012-02-07 MED ORDER — HYDROCODONE-ACETAMINOPHEN 5-325 MG PO TABS: 2.0000 | ORAL_TABLET | ORAL | Status: AC | PRN

## 2012-02-07 MED ORDER — IBUPROFEN 800 MG PO TABS: 800.0000 mg | ORAL_TABLET | Freq: Three times a day (TID) | ORAL | Status: AC

## 2012-02-07 NOTE — ED Provider Notes (Signed)
History/physical exam/procedure(s) were performed by non-physician practitioner and as supervising physician I was immediately available for consultation/collaboration. I have reviewed all notes and am in agreement with care and plan.   Hilario Quarry, MD 02/07/12 [phone]

## 2012-02-07 NOTE — ED Notes (Signed)
Pt. Reports no dentist.

## 2012-02-07 NOTE — ED Notes (Signed)
2 weeks ago her right upper molar broke. Painful.

## 2012-02-07 NOTE — ED Provider Notes (Signed)
History     CSN: [id number]  Arrival date & time 02/07/12  1516   First MD Initiated Contact with Patient 02/07/12 1609      Chief Complaint  Patient presents with  . Dental Pain    (Consider location/radiation/quality/duration/timing/severity/associated sxs/prior treatment) Patient is a 26 y.o. female presenting with tooth pain. The history is provided by the patient. No language interpreter was used.  Dental PainThe primary symptoms include mouth pain. The symptoms began 2 days ago. The symptoms are worsening. The symptoms occur constantly.  Additional symptoms include: gum swelling and gum tenderness.  Pt complains of pain to her right upper back tooth.  Pt reports tooth is breaking off in peices  Past Medical History  Diagnosis Date  . Herpes genitalia     last outbreak early 2012  . Previous cesarean delivery, antepartum condition or complication 07/05/2011  . Tobacco use disorder complicating pregnancy, childbirth, or the puerperium, antepartum condition or complication 07/11/2011    Past Surgical History  Procedure Date  . Cesarean section     2  . Cesarean section 07/20/2011    Procedure: CESAREAN SECTION;  Surgeon: Tereso Newcomer, MD;  Location: [facility];  Service: Gynecology;  Laterality: N/A;    Family History  Problem Relation Age of Onset  . Cancer Maternal Grandmother     lung  . Cancer Maternal Grandfather     lung  . Cancer Paternal Grandfather     lung    History  Substance Use Topics  . Smoking status: Current Some Day Smoker -- 0.2 packs/day for 4 years    Types: Cigarettes  . Smokeless tobacco: Never Used  . Alcohol Use: No    OB History    Grav Para Term Preterm Abortions TAB SAB Ect Mult Living   4 3 3  0 1 0 1 0 0 3      Review of Systems  HENT: Positive for dental problem.   All other systems reviewed and are negative.    Allergies  Review of patient's allergies indicates no known allergies.  Home Medications   Current  Outpatient Rx  Name Route Sig Dispense Refill  . IBUPROFEN 200 MG PO TABS Oral Take 400 mg by mouth every 6 (six) hours as needed. For fever/pain      BP 109/73  Pulse 100  Temp 98.6 F (37 C) (Oral)  Resp 20  SpO2 100%  Breastfeeding? Unknown  Physical Exam  Nursing note and vitals reviewed. Constitutional: She appears well-developed and well-nourished.  HENT:       Broken right upper 3rd molar  Eyes: Pupils are equal, round, and reactive to light.  Neck: Normal range of motion.  Cardiovascular: Normal rate.   Pulmonary/Chest: Effort normal.  Skin: Skin is warm and dry.  Psychiatric: She has a normal mood and affect.    ED Course  Procedures (including critical care time)  Labs Reviewed - No data to display No results found.   No diagnosis found.    MDM  pcn and hydrocodone and ibuprofen.  (Pt request non sedating medication because of children in home)   Pt given number for Dr. Burgess Estelle to see for follow up        Elson Areas, [place] 02/07/12 1633

## 2012-11-24 ENCOUNTER — Encounter: Payer: Self-pay | Admitting: *Deleted

## 2014-05-24 ENCOUNTER — Encounter (HOSPITAL_BASED_OUTPATIENT_CLINIC_OR_DEPARTMENT_OTHER): Payer: Self-pay | Admitting: *Deleted

## 2014-07-19 ENCOUNTER — Encounter (HOSPITAL_BASED_OUTPATIENT_CLINIC_OR_DEPARTMENT_OTHER): Payer: Self-pay

## 2014-07-19 ENCOUNTER — Emergency Department (HOSPITAL_BASED_OUTPATIENT_CLINIC_OR_DEPARTMENT_OTHER)
Admission: EM | Admit: 2014-07-19 | Discharge: 2014-07-19 | Discharge status: Against Medical Advice | Payer: Medicaid Other | Attending: Emergency Medicine | Admitting: Emergency Medicine

## 2014-07-19 DIAGNOSIS — N63 Unspecified lump in breast: Secondary | ICD-10-CM | POA: Diagnosis not present

## 2014-07-19 DIAGNOSIS — Z8659 Personal history of other mental and behavioral disorders: Secondary | ICD-10-CM | POA: Diagnosis not present

## 2014-07-19 DIAGNOSIS — Z8619 Personal history of other infectious and parasitic diseases: Secondary | ICD-10-CM | POA: Insufficient documentation

## 2014-07-19 DIAGNOSIS — Z720 Tobacco use: Secondary | ICD-10-CM | POA: Diagnosis not present

## 2014-07-19 DIAGNOSIS — Z3202 Encounter for pregnancy test, result negative: Secondary | ICD-10-CM | POA: Insufficient documentation

## 2014-07-19 HISTORY — DX: Opioid dependence, uncomplicated: F11.20

## 2014-07-19 LAB — PREGNANCY, URINE: Preg Test, Ur: NEGATIVE

## 2014-07-19 NOTE — ED Notes (Signed)
Pt not in room at this time

## 2014-07-19 NOTE — ED Notes (Signed)
Left breast "knot" x 2-first noticed x 1 week ago

## 2014-07-20 NOTE — ED Provider Notes (Signed)
Patient eloped prior to me seeing her.  Signed,  Ladona MowJoe Vitali Seibert, PA-C 2:06 AM   Monte FantasiaJoseph W Jayla Mackie, PA-C 07/20/14 [phone]

## 2015-01-03 DIAGNOSIS — Z8619 Personal history of other infectious and parasitic diseases: Secondary | ICD-10-CM | POA: Insufficient documentation

## 2015-01-03 DIAGNOSIS — K029 Dental caries, unspecified: Secondary | ICD-10-CM | POA: Diagnosis not present

## 2015-01-03 DIAGNOSIS — Z720 Tobacco use: Secondary | ICD-10-CM | POA: Insufficient documentation

## 2015-01-03 DIAGNOSIS — K088 Other specified disorders of teeth and supporting structures: Secondary | ICD-10-CM | POA: Insufficient documentation

## 2015-01-04 ENCOUNTER — Emergency Department (HOSPITAL_COMMUNITY)
Admission: EM | Admit: 2015-01-04 | Discharge: 2015-01-04 | Disposition: A | Discharge status: Home / Self Care | Payer: Medicaid Other | Attending: Emergency Medicine | Admitting: Emergency Medicine

## 2015-01-04 ENCOUNTER — Encounter (HOSPITAL_COMMUNITY): Payer: Self-pay

## 2015-01-04 DIAGNOSIS — K0889 Other specified disorders of teeth and supporting structures: Secondary | ICD-10-CM

## 2015-01-04 MED ORDER — ONDANSETRON HCL 4 MG PO TABS
4.0000 mg | ORAL_TABLET | Freq: Once | ORAL | Status: AC
  Administered 2015-01-04: 4 mg via ORAL
  Filled 2015-01-04: qty 1

## 2015-01-04 MED ORDER — AMOXICILLIN 500 MG PO CAPS: 500.0000 mg | ORAL_CAPSULE | Freq: Three times a day (TID) | ORAL | Status: AC

## 2015-01-04 MED ORDER — AMOXICILLIN 250 MG PO CAPS
500.0000 mg | ORAL_CAPSULE | Freq: Once | ORAL | Status: AC
  Administered 2015-01-04: 500 mg via ORAL
  Filled 2015-01-04: qty 2

## 2015-01-04 MED ORDER — IBUPROFEN 800 MG PO TABS
800.0000 mg | ORAL_TABLET | Freq: Once | ORAL | Status: AC
  Administered 2015-01-04: 800 mg via ORAL
  Filled 2015-01-04: qty 1

## 2015-01-04 MED ORDER — IBUPROFEN 600 MG PO TABS: 600.0000 mg | ORAL_TABLET | Freq: Four times a day (QID) | ORAL | Status: AC

## 2015-01-04 MED ORDER — ACETAMINOPHEN 500 MG PO TABS
1000.0000 mg | ORAL_TABLET | Freq: Once | ORAL | Status: AC
  Administered 2015-01-04: 1000 mg via ORAL
  Filled 2015-01-04: qty 2

## 2015-01-04 NOTE — ED Provider Notes (Signed)
CSN: [id number]     Arrival date & time 01/03/15  2359 History   First MD Initiated Contact with Patient 01/04/15 0002     Chief Complaint  Patient presents with  . Dental Pain     (Consider location/radiation/quality/duration/timing/severity/associated sxs/prior Treatment) Patient is a 29 y.o. female presenting with tooth pain. The history is provided by the patient.  Dental Pain Location:  Lower Lower teeth location:  32/RL 3rd molar, 31/RL 2nd molar and 30/RL 1st molar Quality:  Aching Severity:  Moderate Onset quality:  Gradual Progression:  Worsening Chronicity: acute on chronic. Context: dental caries and poor dentition   Relieved by:  Nothing Worsened by:  Cold food/drink Associated symptoms: gum swelling   Associated symptoms: no difficulty swallowing, no drooling, no fever and no trismus   Risk factors: lack of dental care and smoking     Past Medical History  Diagnosis Date  . Herpes genitalia     last outbreak early 2012  . Previous cesarean delivery, antepartum condition or complication 07/05/2011  . Tobacco use disorder complicating pregnancy, childbirth, or the puerperium, antepartum condition or complication 07/11/2011  . Opiate dependence    Past Surgical History  Procedure Laterality Date  . Cesarean section      2  . Cesarean section  07/20/2011    Procedure: CESAREAN SECTION;  Surgeon: Tereso Newcomer, MD;  Location: [facility];  Service: Gynecology;  Laterality: N/A;  . Tubal ligation     Family History  Problem Relation Age of Onset  . Cancer Maternal Grandmother     lung  . Cancer Maternal Grandfather     lung  . Cancer Paternal Grandfather     lung   History  Substance Use Topics  . Smoking status: Current Some Day Smoker -- 0.25 packs/day for 4 years    Types: Cigarettes  . Smokeless tobacco: Never Used  . Alcohol Use: No   OB History    Gravida Para Term Preterm AB TAB SAB Ectopic Multiple Living   4 3 3  0 1 0 1 0 0 3     Review of  Systems  Constitutional: Negative for fever.  HENT: Positive for dental problem. Negative for drooling.   All other systems reviewed and are negative.     Allergies  Review of patient's allergies indicates no known allergies.  Home Medications   Prior to Admission medications   Medication Sig Start Date End Date Taking? Authorizing Provider  Buprenorphine HCl-Naloxone HCl (SUBOXONE SL) Place under the tongue.    Historical Provider, MD   BP 108/58 mmHg  Pulse 76  Temp(Src) 98.4 F (36.9 C) (Oral)  Resp 18  Ht 5\' 5"  (1.651 m)  Wt 110 lb (49.896 kg)  BMI 18.31 kg/m2  SpO2 99%  LMP 12/28/2014  Breastfeeding? No Physical Exam  Constitutional: She is oriented to person, place, and time. She appears well-developed and well-nourished.  Non-toxic appearance.  HENT:  Head: Normocephalic.  Right Ear: Tympanic membrane and external ear normal.  Left Ear: Tympanic membrane and external ear normal.  Multiple dental caries noted. There is swelling of the right lower gum. There is no swelling under the tongue. The airway is patent.  Eyes: EOM and lids are normal. Pupils are equal, round, and reactive to light.  Neck: Normal range of motion. Neck supple. Carotid bruit is not present.  No cervical or submental lymphadenopathy appreciated.  Cardiovascular: Normal rate, regular rhythm, normal heart sounds, intact distal pulses and normal pulses.  Pulmonary/Chest: Breath sounds normal. No respiratory distress.  Abdominal: Soft. Bowel sounds are normal. There is no tenderness. There is no guarding.  Musculoskeletal: Normal range of motion.  Lymphadenopathy:       Head (right side): No submandibular adenopathy present.       Head (left side): No submandibular adenopathy present.    She has no cervical adenopathy.  Neurological: She is alert and oriented to person, place, and time. She has normal strength. No cranial nerve deficit or sensory deficit.  Skin: Skin is warm and dry.   Psychiatric: She has a normal mood and affect. Her speech is normal.  Nursing note and vitals reviewed.   ED Course  Procedures (including critical care time) Labs Review Labs Reviewed - No data to display  Imaging Review No results found.   EKG Interpretation None      MDM  Vital signs are noted to be within normal limits. Pulse oximetry is 99% on room air. Patient has multiple dental caries present. The right lower gum is swollen and tender. There is no swelling under the tongue noted. There is no evidence for Lugwig's Angina.  The patient is given a couple of the dental resources that except her Medicaid insurance. Prescription for Amoxil and ibuprofen given to the patient.    Final diagnoses:  None    *I have reviewed nursing notes, vital signs, and all appropriate lab and imaging results for this patient.[address] PA-C 01/04/15 1610  Devoria Albe, MD 01/04/15 [phone]

## 2015-01-04 NOTE — Discharge Instructions (Signed)
It is important that she see a dentist as soon as possible. Please use Amoxil and ibuprofen daily. May use Tylenol in between the ibuprofen doses if needed. Dental Pain Toothache is pain in or around a tooth. It may get worse with chewing or with cold or heat.  HOME CARE  Your dentist may use a numbing medicine during treatment. If so, you may need to avoid eating until the medicine wears off. Ask your dentist about this.  Only take medicine as told by your dentist or doctor.  Avoid chewing food near the painful tooth until after all treatment is done. Ask your dentist about this. GET HELP RIGHT AWAY IF:   The problem gets worse or new problems appear.  You have a fever.  There is redness and puffiness (swelling) of the face, jaw, or neck.  You cannot open your mouth.  There is pain in the jaw.  There is very bad pain that is not helped by medicine. MAKE SURE YOU:   Understand these instructions.  Will watch your condition.  Will get help right away if you are not doing well or get worse. Document Released: 12/26/2007 Document Revised: 10/01/2011 Document Reviewed: 12/26/2007 [facility] Patient Information 2015 [place], [place]. This information is not intended to replace advice given to you by your health care provider. Make sure you discuss any questions you have with your health care provider.

## 2015-01-04 NOTE — ED Notes (Signed)
Pt reports right lower dental pain x3 days - broken teeth, caries, poor dental hygiene noted - pt reports she has no dental resources and cannot find a dentist who will take medicaid.

## 2019-05-11 ENCOUNTER — Emergency Department (HOSPITAL_COMMUNITY)
Admission: EM | Admit: 2019-05-11 | Discharge: 2019-05-11 | Disposition: A | Discharge status: Home / Self Care | Payer: Medicaid Other | Attending: Emergency Medicine | Admitting: Emergency Medicine

## 2019-05-11 ENCOUNTER — Other Ambulatory Visit: Payer: Self-pay

## 2019-05-11 ENCOUNTER — Encounter (HOSPITAL_COMMUNITY): Payer: Self-pay | Admitting: *Deleted

## 2019-05-11 ENCOUNTER — Emergency Department (HOSPITAL_COMMUNITY): Payer: Medicaid Other

## 2019-05-11 DIAGNOSIS — Y939 Activity, unspecified: Secondary | ICD-10-CM | POA: Diagnosis not present

## 2019-05-11 DIAGNOSIS — W230XXA Caught, crushed, jammed, or pinched between moving objects, initial encounter: Secondary | ICD-10-CM | POA: Diagnosis not present

## 2019-05-11 DIAGNOSIS — S60021A Contusion of right index finger without damage to nail, initial encounter: Secondary | ICD-10-CM | POA: Diagnosis not present

## 2019-05-11 DIAGNOSIS — F17210 Nicotine dependence, cigarettes, uncomplicated: Secondary | ICD-10-CM | POA: Insufficient documentation

## 2019-05-11 DIAGNOSIS — Z23 Encounter for immunization: Secondary | ICD-10-CM | POA: Diagnosis not present

## 2019-05-11 DIAGNOSIS — Y92812 Truck as the place of occurrence of the external cause: Secondary | ICD-10-CM | POA: Insufficient documentation

## 2019-05-11 DIAGNOSIS — Z79899 Other long term (current) drug therapy: Secondary | ICD-10-CM | POA: Diagnosis not present

## 2019-05-11 DIAGNOSIS — Y999 Unspecified external cause status: Secondary | ICD-10-CM | POA: Diagnosis not present

## 2019-05-11 MED ORDER — HYDROCODONE-ACETAMINOPHEN 5-325 MG PO TABS: 1.0000 | ORAL_TABLET | Freq: Once | ORAL | Status: DC

## 2019-05-11 MED ORDER — TETANUS-DIPHTH-ACELL PERTUSSIS 5-2.5-18.5 LF-MCG/0.5 IM SUSP
0.5000 mL | Freq: Once | INTRAMUSCULAR | Status: AC
  Administered 2019-05-11: 14:00:00 0.5 mL via INTRAMUSCULAR
  Filled 2019-05-11: qty 0.5

## 2019-05-11 MED ORDER — IBUPROFEN 400 MG PO TABS: 400.0000 mg | ORAL_TABLET | Freq: Once | ORAL | Status: DC

## 2019-05-11 MED ORDER — IBUPROFEN 800 MG PO TABS
800.0000 mg | ORAL_TABLET | Freq: Once | ORAL | Status: AC
  Administered 2019-05-11: 800 mg via ORAL
  Filled 2019-05-11: qty 1

## 2019-05-11 NOTE — Discharge Instructions (Addendum)
Your xray is negative for any fractures or dislocations.  You can help minimize your pain by elevation and continuing to use ice. You may take the ibuprofen prescribed.  If needed, you may add tylenol 650 mg to each ibuprofen dose for better pain relief.  Call Dr. Jeannie Fend for a recheck if your symptoms are not improving or you develop any complications from this injury.  I do not believe you will lose the nail as this injury grows out, but there is a small possibility this will happen over the next month as your nail continues to grow out.

## 2019-05-11 NOTE — ED Provider Notes (Signed)
[facility] EMERGENCY DEPARTMENT Provider Note   CSN: [id number] Arrival date & time: 05/11/19  1230     History   Chief Complaint Chief Complaint  Patient presents with  . Finger Injury    HPI Haley Wiley is a 33 y.o. female with a history significant for opioid dependence, presenting with a crush injury to her distal right index finger.  She accidentally caught her finger in the door of her pickup truck.  She was immediately able to open the door.  She has persistent pain at the site.  She denies numbness distal to the injury site.  She has had no medications prior to arrival.  She does endorse radiation of pain into her forearm with attempts at flexing and extending the finger.  Patient is right-handed.     HPI  Past Medical History:  Diagnosis Date  . Herpes genitalia    last outbreak early 2012  . Opiate dependence ([place])   . Previous cesarean delivery, antepartum condition or complication 82/95/6213  . Tobacco use disorder complicating pregnancy, childbirth, or the puerperium, antepartum condition or complication 08/65/7846    Patient Active Problem List   Diagnosis Date Noted  . Cesarean delivery delivered 07/23/2011  . Smoker 07/11/2011  . Tobacco use disorder complicating pregnancy, childbirth, or the puerperium, antepartum condition or complication 96/29/5284  . Previous cesarean delivery, antepartum condition or complication 13/24/4010  . Cervical dysplasia 07/04/2011  . Supervision of normal pregnancy 07/04/2011  . Herpes genitalia 05/30/2011  . Herpes genitalia     Past Surgical History:  Procedure Laterality Date  . CESAREAN SECTION     2  . CESAREAN SECTION  07/20/2011   Procedure: CESAREAN SECTION;  Surgeon: Osborne Oman, MD;  Location: [facility];  Service: Gynecology;  Laterality: N/A;  . TUBAL LIGATION       OB History    Gravida  4   Para  3   Term  3   Preterm  0   AB  1   Living  3     SAB  1   TAB  0   Ectopic  0   Multiple  0   Live Births  3            Home Medications    Prior to Admission medications   Medication Sig Start Date End Date Taking? Authorizing Provider  amoxicillin (AMOXIL) 500 MG capsule Take 1 capsule (500 mg total) by mouth 3 (three) times daily. 01/04/15   Lily Kocher, PA-C  Buprenorphine HCl-Naloxone HCl (SUBOXONE SL) Place under the tongue.    [provider]  ibuprofen (ADVIL,MOTRIN) 600 MG tablet Take 1 tablet (600 mg total) by mouth 4 (four) times daily. Please take with food 01/04/15   Lily Kocher, PA-C    Family History Family History  Problem Relation Age of Onset  . Cancer Maternal Grandmother        lung  . Cancer Maternal Grandfather        lung  . Cancer Paternal Grandfather        lung    Social History Social History   Tobacco Use  . Smoking status: Current Some Day Smoker    Packs/day: 0.25    Years: 4.00    Pack years: 1.00    Types: Cigarettes  . Smokeless tobacco: Never Used  Substance Use Topics  . Alcohol use: No  . Drug use: Yes     Allergies   Patient has no known  allergies.   Review of Systems Review of Systems  Constitutional: Negative for fever.  Musculoskeletal: Positive for arthralgias. Negative for joint swelling and myalgias.  Neurological: Negative for weakness and numbness.     Physical Exam Updated Vital Signs BP 103/63   Pulse 76   Temp 98.9 F (37.2 C) (Oral)   Resp 18   Ht 5\' 6"  (1.676 m)   Wt 56.7 kg   LMP 05/05/2019 (Exact Date)   SpO2 100%   BMI 20.18 kg/m   Physical Exam Vitals signs and nursing note reviewed.  Constitutional:      Appearance: She is well-developed.  HENT:     Head: Atraumatic.  Neck:     Musculoskeletal: Normal range of motion.  Cardiovascular:     Comments: Pulses equal bilaterally Musculoskeletal:        General: Tenderness and signs of injury present. No swelling or deformity.     Comments: Erythema of right distal index finger without significant  edema.  There is a small approximately 3 mm laceration at the proximal cuticle edge which is hemostatic.  There is a pinpoint area of bruising noted beneath the proximal nailbed.  Distal sensation is intact.  There is no palpable deformity.  Skin:    General: Skin is warm and dry.  Neurological:     Mental Status: She is alert.     Sensory: No sensory deficit.     Deep Tendon Reflexes: Reflexes normal.      ED Treatments / Results  Labs (all labs ordered are listed, but only abnormal results are displayed) Labs Reviewed - No data to display  EKG None  Radiology Dg Finger Index Right  Result Date: 05/11/2019 CLINICAL DATA:  Slammed finger in car door EXAM: RIGHT SECOND FINGER 2+V COMPARISON:  None. FINDINGS: Frontal, oblique, and lateral views were obtained. No fracture or dislocation. Joint spaces appear unremarkable. No erosive change. No radiopaque foreign body. IMPRESSION: No fracture or dislocation.  No evident arthropathy. Electronically Signed   By: Bretta BangWilliam  Woodruff III M.D.   On: 05/11/2019 13:09    Procedures Procedures (including critical care time)  Medications Ordered in ED Medications  Tdap (BOOSTRIX) injection 0.5 mL (0.5 mLs Intramuscular Given 05/11/19 1403)  ibuprofen (ADVIL) tablet 800 mg (800 mg Oral Given 05/11/19 1403)     Initial Impression / Assessment and Plan / ED Course  I have reviewed the triage vital signs and the nursing notes.  Pertinent labs & imaging results that were available during my care of the patient were reviewed by me and considered in my medical decision making (see chart for details).        Patient with a crush injury to her right distal index finger without bony injury.  There is a tiny abrasion/laceration at her cuticle edge not requiring intervention.  She was placed in a finger splint to protect the extremity.  We discussed ice, elevation.  She was prescribed prescription strength ibuprofen, may add Tylenol for additional  pain relief.  She is given referral to orthopedic hand specialty for as needed follow-up as needed, however I doubt she will have long-term complications from this injury.  Her tetanus was updated since she was out of date.  Final Clinical Impressions(s) / ED Diagnoses   Final diagnoses:  Contusion of right index finger without damage to nail, initial encounter    ED Discharge Orders    None       Victoriano Laindol, Zeanna Sunde, PA-C 05/11/19 1514    Hyacinth MeekerMiller,  Arlys John, MD 05/12/19 [phone]

## 2019-05-11 NOTE — ED Triage Notes (Signed)
Caught right index finger in truck door, pain and swelling at site

## 2020-01-28 ENCOUNTER — Emergency Department (HOSPITAL_COMMUNITY)
Admission: EM | Admit: 2020-01-28 | Discharge: 2020-01-28 | Disposition: A | Discharge status: Home / Self Care | Payer: Medicaid Other | Attending: Emergency Medicine | Admitting: Emergency Medicine

## 2020-01-28 ENCOUNTER — Encounter (HOSPITAL_COMMUNITY): Payer: Self-pay | Admitting: Emergency Medicine

## 2020-01-28 ENCOUNTER — Other Ambulatory Visit: Payer: Self-pay

## 2020-01-28 DIAGNOSIS — Y929 Unspecified place or not applicable: Secondary | ICD-10-CM | POA: Diagnosis not present

## 2020-01-28 DIAGNOSIS — Y999 Unspecified external cause status: Secondary | ICD-10-CM | POA: Diagnosis not present

## 2020-01-28 DIAGNOSIS — Y939 Activity, unspecified: Secondary | ICD-10-CM | POA: Diagnosis not present

## 2020-01-28 DIAGNOSIS — S80861A Insect bite (nonvenomous), right lower leg, initial encounter: Secondary | ICD-10-CM | POA: Insufficient documentation

## 2020-01-28 DIAGNOSIS — W57XXXA Bitten or stung by nonvenomous insect and other nonvenomous arthropods, initial encounter: Secondary | ICD-10-CM | POA: Insufficient documentation

## 2020-01-28 DIAGNOSIS — Z5321 Procedure and treatment not carried out due to patient leaving prior to being seen by health care provider: Secondary | ICD-10-CM | POA: Diagnosis not present

## 2020-01-28 NOTE — ED Triage Notes (Signed)
Pt c/o multiple insect bites on bilateral legs. Pt states she thinks they are spider bites.
